# Patient Record
Sex: Male | Born: 1935 | Race: White | Hispanic: No
Health system: Southern US, Community
[De-identification: ages and names within clinical notes are randomized; demographics above are authoritative.]

## PROBLEM LIST (undated history)

## (undated) DIAGNOSIS — C801 Malignant (primary) neoplasm, unspecified: Secondary | ICD-10-CM

---

## 2000-06-09 ENCOUNTER — Ambulatory Visit (HOSPITAL_COMMUNITY): Admission: RE | Admit: 2000-06-09 | Discharge: 2000-06-09 | Payer: Self-pay | Admitting: Family Medicine

## 2000-06-09 ENCOUNTER — Encounter: Payer: Self-pay | Admitting: Family Medicine

## 2000-10-07 ENCOUNTER — Inpatient Hospital Stay (HOSPITAL_COMMUNITY): Admission: EM | Admit: 2000-10-07 | Discharge: 2000-10-12 | Payer: Self-pay | Admitting: Emergency Medicine

## 2000-10-07 ENCOUNTER — Encounter: Payer: Self-pay | Admitting: Emergency Medicine

## 2000-12-16 ENCOUNTER — Emergency Department (HOSPITAL_COMMUNITY): Admission: EM | Admit: 2000-12-16 | Discharge: 2000-12-16 | Payer: Self-pay | Admitting: Emergency Medicine

## 2001-02-04 ENCOUNTER — Emergency Department (HOSPITAL_COMMUNITY): Admission: EM | Admit: 2001-02-04 | Discharge: 2001-02-05 | Payer: Self-pay | Admitting: Emergency Medicine

## 2001-02-04 ENCOUNTER — Encounter: Payer: Self-pay | Admitting: Emergency Medicine

## 2001-04-07 ENCOUNTER — Encounter: Payer: Self-pay | Admitting: Family Medicine

## 2001-04-07 ENCOUNTER — Ambulatory Visit (HOSPITAL_COMMUNITY): Admission: RE | Admit: 2001-04-07 | Discharge: 2001-04-07 | Payer: Self-pay | Admitting: Family Medicine

## 2001-04-10 ENCOUNTER — Encounter: Payer: Self-pay | Admitting: Family Medicine

## 2001-04-10 ENCOUNTER — Ambulatory Visit (HOSPITAL_COMMUNITY): Admission: RE | Admit: 2001-04-10 | Discharge: 2001-04-10 | Payer: Self-pay | Admitting: Family Medicine

## 2001-07-01 ENCOUNTER — Ambulatory Visit (HOSPITAL_COMMUNITY): Admission: RE | Admit: 2001-07-01 | Discharge: 2001-07-01 | Payer: Self-pay | Admitting: Family Medicine

## 2001-07-01 ENCOUNTER — Encounter: Payer: Self-pay | Admitting: Family Medicine

## 2001-07-08 ENCOUNTER — Encounter: Payer: Self-pay | Admitting: *Deleted

## 2001-07-08 ENCOUNTER — Ambulatory Visit (HOSPITAL_COMMUNITY): Admission: RE | Admit: 2001-07-08 | Discharge: 2001-07-08 | Payer: Self-pay | Admitting: *Deleted

## 2001-08-19 ENCOUNTER — Emergency Department (HOSPITAL_COMMUNITY): Admission: EM | Admit: 2001-08-19 | Discharge: 2001-08-19 | Payer: Self-pay | Admitting: Emergency Medicine

## 2001-09-28 ENCOUNTER — Emergency Department (HOSPITAL_COMMUNITY): Admission: EM | Admit: 2001-09-28 | Discharge: 2001-09-28 | Payer: Self-pay | Admitting: Emergency Medicine

## 2001-11-15 ENCOUNTER — Emergency Department (HOSPITAL_COMMUNITY): Admission: EM | Admit: 2001-11-15 | Discharge: 2001-11-15 | Payer: Self-pay | Admitting: Emergency Medicine

## 2002-02-15 ENCOUNTER — Inpatient Hospital Stay (HOSPITAL_COMMUNITY): Admission: EM | Admit: 2002-02-15 | Discharge: 2002-02-21 | Payer: Self-pay | Admitting: Psychiatry

## 2002-03-05 ENCOUNTER — Encounter: Payer: Self-pay | Admitting: Emergency Medicine

## 2002-03-06 ENCOUNTER — Inpatient Hospital Stay (HOSPITAL_COMMUNITY): Admission: EM | Admit: 2002-03-06 | Discharge: 2002-03-16 | Payer: Self-pay | Admitting: Psychiatry

## 2002-03-28 ENCOUNTER — Inpatient Hospital Stay (HOSPITAL_COMMUNITY): Admission: EM | Admit: 2002-03-28 | Discharge: 2002-04-08 | Payer: Self-pay | Admitting: Psychiatry

## 2002-05-30 ENCOUNTER — Inpatient Hospital Stay (HOSPITAL_COMMUNITY): Admission: EM | Admit: 2002-05-30 | Discharge: 2002-06-07 | Payer: Self-pay | Admitting: Psychiatry

## 2002-06-18 ENCOUNTER — Emergency Department (HOSPITAL_COMMUNITY): Admission: EM | Admit: 2002-06-18 | Discharge: 2002-06-18 | Payer: Self-pay | Admitting: Emergency Medicine

## 2002-09-07 ENCOUNTER — Emergency Department (HOSPITAL_COMMUNITY): Admission: EM | Admit: 2002-09-07 | Discharge: 2002-09-07 | Payer: Self-pay | Admitting: Emergency Medicine

## 2002-10-08 ENCOUNTER — Encounter: Payer: Self-pay | Admitting: Emergency Medicine

## 2002-10-08 ENCOUNTER — Emergency Department (HOSPITAL_COMMUNITY): Admission: EM | Admit: 2002-10-08 | Discharge: 2002-10-08 | Payer: Self-pay | Admitting: Emergency Medicine

## 2002-10-30 ENCOUNTER — Emergency Department (HOSPITAL_COMMUNITY): Admission: EM | Admit: 2002-10-30 | Discharge: 2002-10-30 | Payer: Self-pay | Admitting: Emergency Medicine

## 2002-11-08 ENCOUNTER — Emergency Department (HOSPITAL_COMMUNITY): Admission: EM | Admit: 2002-11-08 | Discharge: 2002-11-08 | Payer: Self-pay | Admitting: Emergency Medicine

## 2002-11-08 ENCOUNTER — Encounter: Payer: Self-pay | Admitting: Emergency Medicine

## 2003-02-11 ENCOUNTER — Emergency Department (HOSPITAL_COMMUNITY): Admission: EM | Admit: 2003-02-11 | Discharge: 2003-02-11 | Payer: Self-pay | Admitting: Emergency Medicine

## 2003-02-13 ENCOUNTER — Inpatient Hospital Stay (HOSPITAL_COMMUNITY): Admission: EM | Admit: 2003-02-13 | Discharge: 2003-02-19 | Payer: Self-pay | Admitting: Psychiatry

## 2004-05-04 ENCOUNTER — Emergency Department (HOSPITAL_COMMUNITY): Admission: EM | Admit: 2004-05-04 | Discharge: 2004-05-04 | Payer: Self-pay | Admitting: Emergency Medicine

## 2004-05-08 ENCOUNTER — Emergency Department (HOSPITAL_COMMUNITY): Admission: EM | Admit: 2004-05-08 | Discharge: 2004-05-08 | Payer: Self-pay | Admitting: Emergency Medicine

## 2004-05-11 ENCOUNTER — Emergency Department (HOSPITAL_COMMUNITY): Admission: EM | Admit: 2004-05-11 | Discharge: 2004-05-11 | Payer: Self-pay | Admitting: Emergency Medicine

## 2004-05-15 ENCOUNTER — Encounter (HOSPITAL_COMMUNITY): Admission: RE | Admit: 2004-05-15 | Discharge: 2004-08-13 | Payer: Self-pay | Admitting: Emergency Medicine

## 2004-09-22 ENCOUNTER — Emergency Department (HOSPITAL_COMMUNITY): Admission: EM | Admit: 2004-09-22 | Discharge: 2004-09-23 | Payer: Self-pay | Admitting: Emergency Medicine

## 2007-02-16 ENCOUNTER — Ambulatory Visit: Payer: Self-pay | Admitting: Internal Medicine

## 2007-03-05 ENCOUNTER — Ambulatory Visit: Payer: Self-pay | Admitting: Internal Medicine

## 2007-06-07 ENCOUNTER — Encounter: Payer: Self-pay | Admitting: Internal Medicine

## 2007-06-07 ENCOUNTER — Ambulatory Visit: Payer: Self-pay | Admitting: Internal Medicine

## 2008-10-14 ENCOUNTER — Emergency Department (HOSPITAL_COMMUNITY): Admission: EM | Admit: 2008-10-14 | Discharge: 2008-10-14 | Payer: Self-pay | Admitting: Emergency Medicine

## 2008-11-17 ENCOUNTER — Encounter: Admission: RE | Admit: 2008-11-17 | Discharge: 2008-11-17 | Payer: Self-pay | Admitting: Internal Medicine

## 2008-11-28 ENCOUNTER — Ambulatory Visit (HOSPITAL_COMMUNITY): Admission: RE | Admit: 2008-11-28 | Discharge: 2008-11-28 | Payer: Self-pay | Admitting: Thoracic Surgery

## 2008-11-30 ENCOUNTER — Ambulatory Visit: Payer: Self-pay | Admitting: Thoracic Surgery

## 2008-12-06 ENCOUNTER — Ambulatory Visit (HOSPITAL_COMMUNITY): Admission: RE | Admit: 2008-12-06 | Discharge: 2008-12-06 | Payer: Self-pay | Admitting: Thoracic Surgery

## 2008-12-12 ENCOUNTER — Ambulatory Visit: Payer: Self-pay | Admitting: Thoracic Surgery

## 2009-03-15 ENCOUNTER — Encounter: Payer: Self-pay | Admitting: Internal Medicine

## 2009-03-28 ENCOUNTER — Encounter: Admission: RE | Admit: 2009-03-28 | Discharge: 2009-03-28 | Payer: Self-pay | Admitting: Thoracic Surgery

## 2009-03-28 ENCOUNTER — Encounter: Admission: RE | Admit: 2009-03-28 | Discharge: 2009-03-28 | Payer: Self-pay | Admitting: Otolaryngology

## 2009-03-28 ENCOUNTER — Ambulatory Visit: Payer: Self-pay | Admitting: Thoracic Surgery

## 2009-04-03 ENCOUNTER — Ambulatory Visit (HOSPITAL_COMMUNITY): Admission: RE | Admit: 2009-04-03 | Discharge: 2009-04-03 | Payer: Self-pay | Admitting: Thoracic Surgery

## 2009-04-04 ENCOUNTER — Ambulatory Visit (HOSPITAL_COMMUNITY): Admission: RE | Admit: 2009-04-04 | Discharge: 2009-04-04 | Payer: Self-pay | Admitting: Otolaryngology

## 2009-04-04 ENCOUNTER — Encounter (INDEPENDENT_AMBULATORY_CARE_PROVIDER_SITE_OTHER): Payer: Self-pay | Admitting: Otolaryngology

## 2009-04-10 ENCOUNTER — Ambulatory Visit: Payer: Self-pay | Admitting: Oncology

## 2009-04-16 LAB — COMPREHENSIVE METABOLIC PANEL
ALT: 10 U/L (ref 0–53)
CO2: 24 mEq/L (ref 19–32)
Calcium: 9 mg/dL (ref 8.4–10.5)
Chloride: 101 mEq/L (ref 96–112)
Glucose, Bld: 82 mg/dL (ref 70–99)
Sodium: 137 mEq/L (ref 135–145)
Total Protein: 7.4 g/dL (ref 6.0–8.3)

## 2009-04-16 LAB — CBC WITH DIFFERENTIAL/PLATELET
BASO%: 1.1 % (ref 0.0–2.0)
Eosinophils Absolute: 0.1 10*3/uL (ref 0.0–0.5)
HCT: 42.8 % (ref 38.4–49.9)
LYMPH%: 23.3 % (ref 14.0–49.0)
MCHC: 34.7 g/dL (ref 32.0–36.0)
MONO#: 0.8 10*3/uL (ref 0.1–0.9)
NEUT#: 5.7 10*3/uL (ref 1.5–6.5)
NEUT%: 65.2 % (ref 39.0–75.0)
Platelets: 329 10*3/uL (ref 140–400)
RBC: 4.67 10*6/uL (ref 4.20–5.82)
WBC: 8.7 10*3/uL (ref 4.0–10.3)
lymph#: 2 10*3/uL (ref 0.9–3.3)

## 2009-04-19 ENCOUNTER — Encounter (INDEPENDENT_AMBULATORY_CARE_PROVIDER_SITE_OTHER): Payer: Self-pay | Admitting: Diagnostic Radiology

## 2009-04-19 ENCOUNTER — Ambulatory Visit (HOSPITAL_COMMUNITY): Admission: RE | Admit: 2009-04-19 | Discharge: 2009-04-19 | Payer: Self-pay | Admitting: Thoracic Surgery

## 2009-04-24 ENCOUNTER — Ambulatory Visit: Admission: RE | Admit: 2009-04-24 | Discharge: 2009-07-11 | Payer: Self-pay | Admitting: Radiation Oncology

## 2009-04-24 ENCOUNTER — Ambulatory Visit: Payer: Self-pay | Admitting: Thoracic Surgery

## 2009-04-26 LAB — URINALYSIS, MICROSCOPIC - CHCC
Bilirubin (Urine): NEGATIVE
Blood: NEGATIVE
Glucose: NEGATIVE g/dL

## 2009-04-26 LAB — CBC WITH DIFFERENTIAL/PLATELET
Eosinophils Absolute: 0 10*3/uL (ref 0.0–0.5)
HCT: 39.8 % (ref 38.4–49.9)
LYMPH%: 15.3 % (ref 14.0–49.0)
MONO#: 1.2 10*3/uL — ABNORMAL HIGH (ref 0.1–0.9)
NEUT#: 9.5 10*3/uL — ABNORMAL HIGH (ref 1.5–6.5)
Platelets: 287 10*3/uL (ref 140–400)
RBC: 4.42 10*6/uL (ref 4.20–5.82)
WBC: 12.7 10*3/uL — ABNORMAL HIGH (ref 4.0–10.3)

## 2009-04-26 LAB — COMPREHENSIVE METABOLIC PANEL
BUN: 28 mg/dL — ABNORMAL HIGH (ref 6–23)
CO2: 31 mEq/L (ref 19–32)
Creatinine, Ser: 1.01 mg/dL (ref 0.40–1.50)
Glucose, Bld: 119 mg/dL — ABNORMAL HIGH (ref 70–99)
Total Bilirubin: 0.7 mg/dL (ref 0.3–1.2)
Total Protein: 7.1 g/dL (ref 6.0–8.3)

## 2009-04-26 LAB — PROTHROMBIN TIME: Prothrombin Time: 14.2 seconds (ref 11.6–15.2)

## 2009-04-26 LAB — APTT: aPTT: 34 seconds (ref 24–37)

## 2009-04-27 ENCOUNTER — Ambulatory Visit (HOSPITAL_COMMUNITY): Admission: RE | Admit: 2009-04-27 | Discharge: 2009-04-27 | Payer: Self-pay | Admitting: Urology

## 2009-04-27 ENCOUNTER — Encounter (INDEPENDENT_AMBULATORY_CARE_PROVIDER_SITE_OTHER): Payer: Self-pay | Admitting: Urology

## 2009-04-30 ENCOUNTER — Ambulatory Visit (HOSPITAL_COMMUNITY): Admission: RE | Admit: 2009-04-30 | Discharge: 2009-04-30 | Payer: Self-pay | Admitting: Radiation Oncology

## 2009-05-01 ENCOUNTER — Inpatient Hospital Stay (HOSPITAL_COMMUNITY): Admission: EM | Admit: 2009-05-01 | Discharge: 2009-05-03 | Payer: Self-pay | Admitting: Emergency Medicine

## 2009-05-02 ENCOUNTER — Ambulatory Visit: Payer: Self-pay | Admitting: Dentistry

## 2009-08-01 ENCOUNTER — Ambulatory Visit: Payer: Self-pay | Admitting: Thoracic Surgery

## 2009-08-01 ENCOUNTER — Encounter: Admission: RE | Admit: 2009-08-01 | Discharge: 2009-08-01 | Payer: Self-pay | Admitting: Thoracic Surgery

## 2009-08-07 ENCOUNTER — Ambulatory Visit: Admission: RE | Admit: 2009-08-07 | Discharge: 2009-08-07 | Payer: Self-pay | Admitting: Radiation Oncology

## 2009-08-21 ENCOUNTER — Ambulatory Visit: Payer: Self-pay | Admitting: Thoracic Surgery

## 2009-08-21 ENCOUNTER — Encounter: Admission: RE | Admit: 2009-08-21 | Discharge: 2009-08-21 | Payer: Self-pay | Admitting: Thoracic Surgery

## 2009-08-22 ENCOUNTER — Ambulatory Visit (HOSPITAL_COMMUNITY): Admission: RE | Admit: 2009-08-22 | Discharge: 2009-08-22 | Payer: Self-pay | Admitting: Radiation Oncology

## 2009-08-31 ENCOUNTER — Encounter: Admission: RE | Admit: 2009-08-31 | Discharge: 2009-10-31 | Payer: Self-pay | Admitting: Radiation Oncology

## 2009-08-31 ENCOUNTER — Ambulatory Visit: Payer: Self-pay | Admitting: Vascular Surgery

## 2009-10-04 ENCOUNTER — Ambulatory Visit (HOSPITAL_COMMUNITY): Admission: RE | Admit: 2009-10-04 | Discharge: 2009-10-04 | Payer: Self-pay | Admitting: Radiation Oncology

## 2009-10-10 ENCOUNTER — Ambulatory Visit: Payer: Self-pay | Admitting: Thoracic Surgery

## 2009-12-07 ENCOUNTER — Ambulatory Visit (HOSPITAL_COMMUNITY): Admission: RE | Admit: 2009-12-07 | Discharge: 2009-12-07 | Payer: Self-pay | Admitting: Otolaryngology

## 2010-01-04 ENCOUNTER — Inpatient Hospital Stay (HOSPITAL_COMMUNITY): Admission: RE | Admit: 2010-01-04 | Discharge: 2010-01-13 | Payer: Self-pay | Admitting: Otolaryngology

## 2010-01-08 ENCOUNTER — Ambulatory Visit: Payer: Self-pay | Admitting: Thoracic Surgery

## 2010-01-09 ENCOUNTER — Ambulatory Visit: Payer: Self-pay | Admitting: Thoracic Surgery

## 2010-01-09 ENCOUNTER — Encounter (INDEPENDENT_AMBULATORY_CARE_PROVIDER_SITE_OTHER): Payer: Self-pay | Admitting: Otolaryngology

## 2010-01-11 ENCOUNTER — Ambulatory Visit: Payer: Self-pay | Admitting: Oncology

## 2010-01-16 ENCOUNTER — Ambulatory Visit: Payer: Self-pay | Admitting: Thoracic Surgery

## 2010-01-16 ENCOUNTER — Encounter: Admission: RE | Admit: 2010-01-16 | Discharge: 2010-01-16 | Payer: Self-pay | Admitting: Thoracic Surgery

## 2010-05-15 ENCOUNTER — Ambulatory Visit: Payer: Self-pay | Admitting: Thoracic Surgery

## 2010-05-15 ENCOUNTER — Encounter: Admission: RE | Admit: 2010-05-15 | Discharge: 2010-05-15 | Payer: Self-pay | Admitting: Thoracic Surgery

## 2010-05-23 ENCOUNTER — Ambulatory Visit (HOSPITAL_COMMUNITY): Admission: RE | Admit: 2010-05-23 | Discharge: 2010-05-23 | Payer: Self-pay | Admitting: Thoracic Surgery

## 2010-05-28 ENCOUNTER — Ambulatory Visit: Payer: Self-pay | Admitting: Thoracic Surgery

## 2010-05-29 ENCOUNTER — Ambulatory Visit: Payer: Self-pay | Admitting: Oncology

## 2010-05-29 LAB — CBC WITH DIFFERENTIAL/PLATELET
Eosinophils Absolute: 0.1 10*3/uL (ref 0.0–0.5)
HCT: 44 % (ref 38.4–49.9)
HGB: 15.1 g/dL (ref 13.0–17.1)
LYMPH%: 12.9 % — ABNORMAL LOW (ref 14.0–49.0)
MONO#: 0.7 10*3/uL (ref 0.1–0.9)
NEUT#: 5.1 10*3/uL (ref 1.5–6.5)
NEUT%: 75.2 % — ABNORMAL HIGH (ref 39.0–75.0)
Platelets: 217 10*3/uL (ref 140–400)
WBC: 6.7 10*3/uL (ref 4.0–10.3)
lymph#: 0.9 10*3/uL (ref 0.9–3.3)

## 2010-05-29 LAB — COMPREHENSIVE METABOLIC PANEL
ALT: <8 U/L (ref 0–53)
CO2: 30 mEq/L (ref 19–32)
Calcium: 9 mg/dL (ref 8.4–10.5)
Chloride: 98 mEq/L (ref 96–112)
Creatinine, Ser: 1.03 mg/dL (ref 0.40–1.50)
Glucose, Bld: 84 mg/dL (ref 70–99)
Total Bilirubin: 0.5 mg/dL (ref 0.3–1.2)
Total Protein: 7.4 g/dL (ref 6.0–8.3)

## 2010-06-05 ENCOUNTER — Ambulatory Visit (HOSPITAL_COMMUNITY): Admission: RE | Admit: 2010-06-05 | Discharge: 2010-06-05 | Payer: Self-pay | Admitting: Oncology

## 2010-06-17 ENCOUNTER — Ambulatory Visit: Payer: Self-pay | Admitting: Gastroenterology

## 2010-06-17 ENCOUNTER — Ambulatory Visit (HOSPITAL_COMMUNITY): Admission: RE | Admit: 2010-06-17 | Discharge: 2010-06-17 | Payer: Self-pay | Admitting: Oncology

## 2010-07-03 ENCOUNTER — Ambulatory Visit: Payer: Self-pay | Admitting: Oncology

## 2010-07-05 LAB — CBC WITH DIFFERENTIAL/PLATELET
BASO%: 0.3 % (ref 0.0–2.0)
LYMPH%: 17.2 % (ref 14.0–49.0)
MCH: 30.6 pg (ref 27.2–33.4)
MCHC: 33.5 g/dL (ref 32.0–36.0)
MCV: 91.4 fL (ref 79.3–98.0)
MONO%: 8.3 % (ref 0.0–14.0)
NEUT%: 73.6 % (ref 39.0–75.0)
Platelets: 169 10*3/uL (ref 140–400)
RBC: 5 10*6/uL (ref 4.20–5.82)
nRBC: 0 % (ref 0–0)

## 2010-07-05 LAB — COMPREHENSIVE METABOLIC PANEL
ALT: 8 U/L (ref 0–53)
AST: 17 U/L (ref 0–37)
Albumin: 3.6 g/dL (ref 3.5–5.2)
Alkaline Phosphatase: 106 U/L (ref 39–117)
Glucose, Bld: 73 mg/dL (ref 70–99)
Potassium: 3.6 mEq/L (ref 3.5–5.3)
Sodium: 138 mEq/L (ref 135–145)
Total Protein: 8 g/dL (ref 6.0–8.3)

## 2010-07-12 LAB — CBC WITH DIFFERENTIAL/PLATELET
BASO%: 0.5 % (ref 0.0–2.0)
Basophils Absolute: 0 10*3/uL (ref 0.0–0.1)
EOS%: 1.7 % (ref 0.0–7.0)
MCH: 30.3 pg (ref 27.2–33.4)
MCHC: 33.6 g/dL (ref 32.0–36.0)
MCV: 90.2 fL (ref 79.3–98.0)
MONO%: 7.9 % (ref 0.0–14.0)
RBC: 4.82 10*6/uL (ref 4.20–5.82)
RDW: 12.6 % (ref 11.0–14.6)
nRBC: 0 % (ref 0–0)

## 2010-07-13 LAB — COMPREHENSIVE METABOLIC PANEL
AST: 20 U/L (ref 0–37)
Alkaline Phosphatase: 107 U/L (ref 39–117)
Glucose, Bld: 97 mg/dL (ref 70–99)
Sodium: 135 mEq/L (ref 135–145)
Total Bilirubin: 0.4 mg/dL (ref 0.3–1.2)
Total Protein: 7.2 g/dL (ref 6.0–8.3)

## 2010-07-17 ENCOUNTER — Encounter: Admission: AD | Admit: 2010-07-17 | Discharge: 2010-07-17 | Payer: Self-pay | Admitting: Dentistry

## 2010-07-17 ENCOUNTER — Ambulatory Visit: Payer: Self-pay | Admitting: Dentistry

## 2010-07-19 LAB — CBC WITH DIFFERENTIAL/PLATELET
Eosinophils Absolute: 0.1 10*3/uL (ref 0.0–0.5)
HCT: 41.3 % (ref 38.4–49.9)
LYMPH%: 20.2 % (ref 14.0–49.0)
MCHC: 33.7 g/dL (ref 32.0–36.0)
MONO#: 0.6 10*3/uL (ref 0.1–0.9)
NEUT#: 3.1 10*3/uL (ref 1.5–6.5)
NEUT%: 64.1 % (ref 39.0–75.0)
Platelets: 144 10*3/uL (ref 140–400)
WBC: 4.8 10*3/uL (ref 4.0–10.3)

## 2010-07-19 LAB — COMPREHENSIVE METABOLIC PANEL
BUN: 9 mg/dL (ref 6–23)
CO2: 27 mEq/L (ref 19–32)
Calcium: 8.7 mg/dL (ref 8.4–10.5)
Chloride: 100 mEq/L (ref 96–112)
Creatinine, Ser: 0.92 mg/dL (ref 0.40–1.50)

## 2010-07-26 LAB — CBC WITH DIFFERENTIAL/PLATELET
BASO%: 0.2 % (ref 0.0–2.0)
Basophils Absolute: 0 10*3/uL (ref 0.0–0.1)
HCT: 41.4 % (ref 38.4–49.9)
HGB: 14.1 g/dL (ref 13.0–17.1)
MONO#: 0.6 10*3/uL (ref 0.1–0.9)
NEUT%: 63.8 % (ref 39.0–75.0)
WBC: 4.4 10*3/uL (ref 4.0–10.3)
lymph#: 1 10*3/uL (ref 0.9–3.3)

## 2010-07-26 LAB — BASIC METABOLIC PANEL
BUN: 11 mg/dL (ref 6–23)
CO2: 26 mEq/L (ref 19–32)
Chloride: 99 mEq/L (ref 96–112)
Creatinine, Ser: 0.85 mg/dL (ref 0.40–1.50)

## 2010-08-02 ENCOUNTER — Ambulatory Visit: Payer: Self-pay | Admitting: Oncology

## 2010-08-02 LAB — COMPREHENSIVE METABOLIC PANEL
Alkaline Phosphatase: 89 U/L (ref 39–117)
BUN: 9 mg/dL (ref 6–23)
Creatinine, Ser: 0.75 mg/dL (ref 0.40–1.50)
Glucose, Bld: 84 mg/dL (ref 70–99)
Sodium: 138 mEq/L (ref 135–145)
Total Bilirubin: 0.3 mg/dL (ref 0.3–1.2)

## 2010-08-02 LAB — CBC WITH DIFFERENTIAL/PLATELET
Basophils Absolute: 0 10*3/uL (ref 0.0–0.1)
EOS%: 0.4 % (ref 0.0–7.0)
HGB: 13.7 g/dL (ref 13.0–17.1)
LYMPH%: 22.5 % (ref 14.0–49.0)
MCH: 30.4 pg (ref 27.2–33.4)
MCV: 89.4 fL (ref 79.3–98.0)
MONO%: 14.8 % — ABNORMAL HIGH (ref 0.0–14.0)
Platelets: 198 10*3/uL (ref 140–400)
RDW: 14 % (ref 11.0–14.6)

## 2010-08-02 LAB — MAGNESIUM: Magnesium: 1.9 mg/dL (ref 1.5–2.5)

## 2010-08-09 LAB — COMPREHENSIVE METABOLIC PANEL
Creatinine, Ser: 0.77 mg/dL (ref 0.40–1.50)
Glucose, Bld: 91 mg/dL (ref 70–99)
Potassium: 4.1 mEq/L (ref 3.5–5.3)
Sodium: 138 mEq/L (ref 135–145)
Total Protein: 6 g/dL (ref 6.0–8.3)

## 2010-08-09 LAB — CBC WITH DIFFERENTIAL/PLATELET
BASO%: 0.9 % (ref 0.0–2.0)
Eosinophils Absolute: 0 10*3/uL (ref 0.0–0.5)
HCT: 38.3 % — ABNORMAL LOW (ref 38.4–49.9)
HGB: 13 g/dL (ref 13.0–17.1)
MCHC: 33.9 g/dL (ref 32.0–36.0)
MONO#: 0.4 10*3/uL (ref 0.1–0.9)
NEUT#: 2.3 10*3/uL (ref 1.5–6.5)
NEUT%: 65.8 % (ref 39.0–75.0)
WBC: 3.5 10*3/uL — ABNORMAL LOW (ref 4.0–10.3)
lymph#: 0.7 10*3/uL — ABNORMAL LOW (ref 0.9–3.3)

## 2010-08-09 LAB — MAGNESIUM: Magnesium: 2 mg/dL (ref 1.5–2.5)

## 2010-08-15 LAB — CBC WITH DIFFERENTIAL/PLATELET
Basophils Absolute: 0 10*3/uL (ref 0.0–0.1)
EOS%: 0.5 % (ref 0.0–7.0)
HCT: 35.7 % — ABNORMAL LOW (ref 38.4–49.9)
HGB: 12.5 g/dL — ABNORMAL LOW (ref 13.0–17.1)
MCH: 31.6 pg (ref 27.2–33.4)
MONO#: 0.4 10*3/uL (ref 0.1–0.9)
NEUT%: 63.6 % (ref 39.0–75.0)
Platelets: 194 10*3/uL (ref 140–400)
lymph#: 0.7 10*3/uL — ABNORMAL LOW (ref 0.9–3.3)

## 2010-08-15 LAB — COMPREHENSIVE METABOLIC PANEL
BUN: 6 mg/dL (ref 6–23)
CO2: 25 mEq/L (ref 19–32)
Calcium: 8.6 mg/dL (ref 8.4–10.5)
Chloride: 101 mEq/L (ref 96–112)
Creatinine, Ser: 0.68 mg/dL (ref 0.40–1.50)

## 2010-08-23 LAB — CBC WITH DIFFERENTIAL/PLATELET
Basophils Absolute: 0.1 10*3/uL (ref 0.0–0.1)
Eosinophils Absolute: 0 10*3/uL (ref 0.0–0.5)
HCT: 38 % — ABNORMAL LOW (ref 38.4–49.9)
HGB: 12.8 g/dL — ABNORMAL LOW (ref 13.0–17.1)
MONO#: 0.5 10*3/uL (ref 0.1–0.9)
NEUT%: 58.9 % (ref 39.0–75.0)
WBC: 3.4 10*3/uL — ABNORMAL LOW (ref 4.0–10.3)
lymph#: 0.9 10*3/uL (ref 0.9–3.3)

## 2010-08-30 LAB — COMPREHENSIVE METABOLIC PANEL
Albumin: 3.8 g/dL (ref 3.5–5.2)
Alkaline Phosphatase: 96 U/L (ref 39–117)
BUN: 6 mg/dL (ref 6–23)
Calcium: 8.6 mg/dL (ref 8.4–10.5)
Glucose, Bld: 60 mg/dL — ABNORMAL LOW (ref 70–99)
Potassium: 3.2 mEq/L — ABNORMAL LOW (ref 3.5–5.3)

## 2010-08-30 LAB — CBC WITH DIFFERENTIAL/PLATELET
Basophils Absolute: 0 10*3/uL (ref 0.0–0.1)
Eosinophils Absolute: 0 10*3/uL (ref 0.0–0.5)
HGB: 12.3 g/dL — ABNORMAL LOW (ref 13.0–17.1)
MCV: 92.7 fL (ref 79.3–98.0)
MONO%: 19.5 % — ABNORMAL HIGH (ref 0.0–14.0)
NEUT#: 1.8 10*3/uL (ref 1.5–6.5)
Platelets: 170 10*3/uL (ref 140–400)
RDW: 20 % — ABNORMAL HIGH (ref 11.0–14.6)

## 2010-08-30 LAB — MAGNESIUM: Magnesium: 1.9 mg/dL (ref 1.5–2.5)

## 2010-09-04 ENCOUNTER — Ambulatory Visit: Payer: Self-pay | Admitting: Oncology

## 2010-09-06 LAB — COMPREHENSIVE METABOLIC PANEL
CO2: 26 mEq/L (ref 19–32)
Creatinine, Ser: 0.77 mg/dL (ref 0.40–1.50)
Glucose, Bld: 88 mg/dL (ref 70–99)
Total Bilirubin: 0.6 mg/dL (ref 0.3–1.2)

## 2010-09-06 LAB — CBC WITH DIFFERENTIAL/PLATELET
BASO%: 0.5 % (ref 0.0–2.0)
Basophils Absolute: 0 10*3/uL (ref 0.0–0.1)
Eosinophils Absolute: 0 10*3/uL (ref 0.0–0.5)
HCT: 37.8 % — ABNORMAL LOW (ref 38.4–49.9)
HGB: 12.7 g/dL — ABNORMAL LOW (ref 13.0–17.1)
MONO#: 0.4 10*3/uL (ref 0.1–0.9)
NEUT#: 2.8 10*3/uL (ref 1.5–6.5)
NEUT%: 63.4 % (ref 39.0–75.0)
WBC: 4.4 10*3/uL (ref 4.0–10.3)
lymph#: 1.1 10*3/uL (ref 0.9–3.3)

## 2010-09-06 LAB — MAGNESIUM: Magnesium: 1.9 mg/dL (ref 1.5–2.5)

## 2010-09-13 LAB — COMPREHENSIVE METABOLIC PANEL
ALT: 13 U/L (ref 0–53)
Albumin: 4 g/dL (ref 3.5–5.2)
Alkaline Phosphatase: 98 U/L (ref 39–117)
CO2: 24 mEq/L (ref 19–32)
Glucose, Bld: 150 mg/dL — ABNORMAL HIGH (ref 70–99)
Potassium: 4 mEq/L (ref 3.5–5.3)
Sodium: 141 mEq/L (ref 135–145)
Total Protein: 6.9 g/dL (ref 6.0–8.3)

## 2010-09-13 LAB — CBC WITH DIFFERENTIAL/PLATELET
Basophils Absolute: 0.1 10*3/uL (ref 0.0–0.1)
EOS%: 1 % (ref 0.0–7.0)
Eosinophils Absolute: 0 10*3/uL (ref 0.0–0.5)
HGB: 12.4 g/dL — ABNORMAL LOW (ref 13.0–17.1)
NEUT#: 1.8 10*3/uL (ref 1.5–6.5)
RDW: 19.3 % — ABNORMAL HIGH (ref 11.0–14.6)
lymph#: 1.6 10*3/uL (ref 0.9–3.3)
nRBC: 0 % (ref 0–0)

## 2010-09-20 LAB — COMPREHENSIVE METABOLIC PANEL
Albumin: 3.9 g/dL (ref 3.5–5.2)
CO2: 24 mEq/L (ref 19–32)
Calcium: 8.6 mg/dL (ref 8.4–10.5)
Glucose, Bld: 85 mg/dL (ref 70–99)
Potassium: 4.4 mEq/L (ref 3.5–5.3)
Sodium: 139 mEq/L (ref 135–145)
Total Bilirubin: 0.5 mg/dL (ref 0.3–1.2)
Total Protein: 6.3 g/dL (ref 6.0–8.3)

## 2010-09-20 LAB — CBC WITH DIFFERENTIAL/PLATELET
Basophils Absolute: 0 10*3/uL (ref 0.0–0.1)
Eosinophils Absolute: 0 10*3/uL (ref 0.0–0.5)
HGB: 12.8 g/dL — ABNORMAL LOW (ref 13.0–17.1)
MCV: 94.3 fL (ref 79.3–98.0)
MONO%: 16.7 % — ABNORMAL HIGH (ref 0.0–14.0)
NEUT#: 1.8 10*3/uL (ref 1.5–6.5)
RDW: 19.5 % — ABNORMAL HIGH (ref 11.0–14.6)

## 2010-09-20 LAB — MAGNESIUM: Magnesium: 1.7 mg/dL (ref 1.5–2.5)

## 2010-09-27 LAB — CBC WITH DIFFERENTIAL/PLATELET
BASO%: 0.8 % (ref 0.0–2.0)
MCHC: 33.7 g/dL (ref 32.0–36.0)
MONO#: 0.8 10*3/uL (ref 0.1–0.9)
RBC: 3.81 10*6/uL — ABNORMAL LOW (ref 4.20–5.82)
RDW: 20 % — ABNORMAL HIGH (ref 11.0–14.6)
WBC: 4.9 10*3/uL (ref 4.0–10.3)
lymph#: 1.4 10*3/uL (ref 0.9–3.3)
nRBC: 1 % — ABNORMAL HIGH (ref 0–0)

## 2010-10-09 ENCOUNTER — Ambulatory Visit (HOSPITAL_COMMUNITY)
Admission: RE | Admit: 2010-10-09 | Discharge: 2010-10-09 | Payer: Self-pay | Source: Home / Self Care | Attending: Oncology | Admitting: Oncology

## 2010-10-09 ENCOUNTER — Ambulatory Visit: Payer: Self-pay | Admitting: Oncology

## 2010-10-11 LAB — CBC WITH DIFFERENTIAL/PLATELET
BASO%: 0.2 % (ref 0.0–2.0)
EOS%: 0.1 % (ref 0.0–7.0)
HCT: 42.7 % (ref 38.4–49.9)
LYMPH%: 10 % — ABNORMAL LOW (ref 14.0–49.0)
MCH: 32.4 pg (ref 27.2–33.4)
MCHC: 32.8 g/dL (ref 32.0–36.0)
MONO#: 1.7 10*3/uL — ABNORMAL HIGH (ref 0.1–0.9)
MONO%: 8.4 % (ref 0.0–14.0)
NEUT%: 81.3 % — ABNORMAL HIGH (ref 39.0–75.0)
Platelets: 169 10*3/uL (ref 140–400)
RBC: 4.32 10*6/uL (ref 4.20–5.82)
WBC: 20.5 10*3/uL — ABNORMAL HIGH (ref 4.0–10.3)
nRBC: 0 % (ref 0–0)

## 2010-10-12 LAB — COMPREHENSIVE METABOLIC PANEL
ALT: 9 U/L (ref 0–53)
Albumin: 3.5 g/dL (ref 3.5–5.2)
Alkaline Phosphatase: 177 U/L — ABNORMAL HIGH (ref 39–117)
CO2: 25 mEq/L (ref 19–32)
Glucose, Bld: 199 mg/dL — ABNORMAL HIGH (ref 70–99)
Potassium: 4 mEq/L (ref 3.5–5.3)
Sodium: 140 mEq/L (ref 135–145)
Total Bilirubin: 0.6 mg/dL (ref 0.3–1.2)
Total Protein: 6.2 g/dL (ref 6.0–8.3)

## 2010-10-18 ENCOUNTER — Ambulatory Visit (HOSPITAL_COMMUNITY)
Admission: RE | Admit: 2010-10-18 | Discharge: 2010-10-18 | Payer: Self-pay | Source: Home / Self Care | Attending: Oncology | Admitting: Oncology

## 2010-10-18 LAB — CBC WITH DIFFERENTIAL/PLATELET
Basophils Absolute: 0 10*3/uL (ref 0.0–0.1)
EOS%: 0.6 % (ref 0.0–7.0)
Eosinophils Absolute: 0 10*3/uL (ref 0.0–0.5)
HGB: 12.3 g/dL — ABNORMAL LOW (ref 13.0–17.1)
LYMPH%: 13.3 % — ABNORMAL LOW (ref 14.0–49.0)
MCH: 32.5 pg (ref 27.2–33.4)
MCV: 98.2 fL — ABNORMAL HIGH (ref 79.3–98.0)
MONO%: 8.7 % (ref 0.0–14.0)
NEUT#: 5.3 10*3/uL (ref 1.5–6.5)
Platelets: 149 10*3/uL (ref 140–400)
RBC: 3.79 10*6/uL — ABNORMAL LOW (ref 4.20–5.82)

## 2010-10-18 LAB — COMPREHENSIVE METABOLIC PANEL
ALT: <8 U/L (ref 0–53)
AST: 15 U/L (ref 0–37)
Albumin: 3.3 g/dL — ABNORMAL LOW (ref 3.5–5.2)
Alkaline Phosphatase: 142 U/L — ABNORMAL HIGH (ref 39–117)
BUN: 18 mg/dL (ref 6–23)
Chloride: 106 mEq/L (ref 96–112)
Potassium: 4.3 mEq/L (ref 3.5–5.3)

## 2010-11-01 LAB — CBC WITH DIFFERENTIAL/PLATELET
BASO%: 0.8 % (ref 0.0–2.0)
Basophils Absolute: 0 10*3/uL (ref 0.0–0.1)
EOS%: 0.8 % (ref 0.0–7.0)
MCH: 32.6 pg (ref 27.2–33.4)
MCHC: 32.9 g/dL (ref 32.0–36.0)
MCV: 99 fL — ABNORMAL HIGH (ref 79.3–98.0)
MONO%: 20.4 % — ABNORMAL HIGH (ref 0.0–14.0)
RBC: 3.87 10*6/uL — ABNORMAL LOW (ref 4.20–5.82)
RDW: 15.5 % — ABNORMAL HIGH (ref 11.0–14.6)
lymph#: 1.7 10*3/uL (ref 0.9–3.3)

## 2010-11-08 ENCOUNTER — Ambulatory Visit (HOSPITAL_BASED_OUTPATIENT_CLINIC_OR_DEPARTMENT_OTHER): Payer: MEDICARE | Admitting: Oncology

## 2010-11-08 LAB — CBC WITH DIFFERENTIAL/PLATELET
BASO%: 1.3 % (ref 0.0–2.0)
Basophils Absolute: 0.1 10*3/uL (ref 0.0–0.1)
EOS%: 0.6 % (ref 0.0–7.0)
Eosinophils Absolute: 0 10*3/uL (ref 0.0–0.5)
HCT: 37.6 % — ABNORMAL LOW (ref 38.4–49.9)
HGB: 12.6 g/dL — ABNORMAL LOW (ref 13.0–17.1)
LYMPH%: 25.3 % (ref 14.0–49.0)
MCH: 33.4 pg (ref 27.2–33.4)
MCHC: 33.5 g/dL (ref 32.0–36.0)
MCV: 99.7 fL — ABNORMAL HIGH (ref 79.3–98.0)
MONO#: 0.8 10*3/uL (ref 0.1–0.9)
MONO%: 10.1 % (ref 0.0–14.0)
NEUT#: 4.8 10*3/uL (ref 1.5–6.5)
NEUT%: 62.7 % (ref 39.0–75.0)
Platelets: 200 10*3/uL (ref 140–400)
RBC: 3.77 10*6/uL — ABNORMAL LOW (ref 4.20–5.82)
RDW: 16.3 % — ABNORMAL HIGH (ref 11.0–14.6)
WBC: 7.7 10*3/uL (ref 4.0–10.3)
lymph#: 1.9 10*3/uL (ref 0.9–3.3)

## 2010-11-08 LAB — COMPREHENSIVE METABOLIC PANEL
ALT: <8 U/L (ref 0–53)
AST: 16 U/L (ref 0–37)
Albumin: 3.6 g/dL (ref 3.5–5.2)
Alkaline Phosphatase: 195 U/L — ABNORMAL HIGH (ref 39–117)
BUN: 7 mg/dL (ref 6–23)
CO2: 26 mEq/L (ref 19–32)
Calcium: 8.6 mg/dL (ref 8.4–10.5)
Chloride: 104 mEq/L (ref 96–112)
Creatinine, Ser: 0.78 mg/dL (ref 0.40–1.50)
Glucose, Bld: 74 mg/dL (ref 70–99)
Potassium: 4.2 mEq/L (ref 3.5–5.3)
Sodium: 139 mEq/L (ref 135–145)
Total Bilirubin: 0.2 mg/dL — ABNORMAL LOW (ref 0.3–1.2)
Total Protein: 6.5 g/dL (ref 6.0–8.3)

## 2010-11-08 LAB — MAGNESIUM: Magnesium: 1.8 mg/dL (ref 1.5–2.5)

## 2010-11-14 ENCOUNTER — Ambulatory Visit (HOSPITAL_COMMUNITY)
Admission: RE | Admit: 2010-11-14 | Discharge: 2010-11-14 | Payer: Self-pay | Source: Home / Self Care | Attending: Oncology | Admitting: Oncology

## 2010-11-15 LAB — CBC WITH DIFFERENTIAL/PLATELET
BASO%: 0.5 % (ref 0.0–2.0)
Basophils Absolute: 0 10*3/uL (ref 0.0–0.1)
EOS%: 0.6 % (ref 0.0–7.0)
Eosinophils Absolute: 0 10*3/uL (ref 0.0–0.5)
HCT: 39.7 % (ref 38.4–49.9)
HGB: 13.2 g/dL (ref 13.0–17.1)
LYMPH%: 19.2 % (ref 14.0–49.0)
MCH: 32.5 pg (ref 27.2–33.4)
MCHC: 33.2 g/dL (ref 32.0–36.0)
MCV: 97.8 fL (ref 79.3–98.0)
MONO#: 0.4 10*3/uL (ref 0.1–0.9)
MONO%: 6.9 % (ref 0.0–14.0)
NEUT#: 4.7 10*3/uL (ref 1.5–6.5)
NEUT%: 72.8 % (ref 39.0–75.0)
Platelets: 149 10*3/uL (ref 140–400)
RBC: 4.06 10*6/uL — ABNORMAL LOW (ref 4.20–5.82)
RDW: 14.5 % (ref 11.0–14.6)
WBC: 6.4 10*3/uL (ref 4.0–10.3)
lymph#: 1.2 10*3/uL (ref 0.9–3.3)
nRBC: 0 % (ref 0–0)

## 2010-11-15 LAB — COMPREHENSIVE METABOLIC PANEL
ALT: 8 U/L (ref 0–53)
AST: 20 U/L (ref 0–37)
Albumin: 3.6 g/dL (ref 3.5–5.2)
Alkaline Phosphatase: 165 U/L — ABNORMAL HIGH (ref 39–117)
BUN: 8 mg/dL (ref 6–23)
CO2: 24 mEq/L (ref 19–32)
Calcium: 8.7 mg/dL (ref 8.4–10.5)
Chloride: 105 mEq/L (ref 96–112)
Creatinine, Ser: 0.65 mg/dL (ref 0.40–1.50)
Glucose, Bld: 119 mg/dL — ABNORMAL HIGH (ref 70–99)
Potassium: 3.9 mEq/L (ref 3.5–5.3)
Sodium: 139 mEq/L (ref 135–145)
Total Bilirubin: 0.3 mg/dL (ref 0.3–1.2)
Total Protein: 6.5 g/dL (ref 6.0–8.3)

## 2010-11-15 LAB — MAGNESIUM: Magnesium: 1.6 mg/dL (ref 1.5–2.5)

## 2010-11-24 ENCOUNTER — Encounter: Payer: Self-pay | Admitting: Radiation Oncology

## 2010-11-24 ENCOUNTER — Encounter: Payer: Self-pay | Admitting: Thoracic Surgery

## 2010-11-25 ENCOUNTER — Encounter: Payer: Self-pay | Admitting: Oncology

## 2010-11-29 LAB — CBC WITH DIFFERENTIAL/PLATELET
BASO%: 0.4 % (ref 0.0–2.0)
EOS%: 0.5 % (ref 0.0–7.0)
Eosinophils Absolute: 0 10*3/uL (ref 0.0–0.5)
HCT: 38.6 % (ref 38.4–49.9)
HGB: 12.8 g/dL — ABNORMAL LOW (ref 13.0–17.1)
LYMPH%: 23.7 % (ref 14.0–49.0)
MCH: 32.8 pg (ref 27.2–33.4)
MCHC: 33.2 g/dL (ref 32.0–36.0)
MCV: 98.8 fL — ABNORMAL HIGH (ref 79.3–98.0)
MONO%: 14.2 % — ABNORMAL HIGH (ref 0.0–14.0)
NEUT#: 4.2 10*3/uL (ref 1.5–6.5)
Platelets: 234 10*3/uL (ref 140–400)
RDW: 15.2 % — ABNORMAL HIGH (ref 11.0–14.6)

## 2010-11-29 LAB — COMPREHENSIVE METABOLIC PANEL
ALT: 10 U/L (ref 0–53)
AST: 18 U/L (ref 0–37)
Albumin: 3.5 g/dL (ref 3.5–5.2)
BUN: 6 mg/dL (ref 6–23)
CO2: 25 mEq/L (ref 19–32)
Chloride: 105 mEq/L (ref 96–112)
Glucose, Bld: 88 mg/dL (ref 70–99)
Potassium: 4.1 mEq/L (ref 3.5–5.3)
Total Bilirubin: 0.3 mg/dL (ref 0.3–1.2)
Total Protein: 6.3 g/dL (ref 6.0–8.3)

## 2010-11-29 LAB — MAGNESIUM: Magnesium: 1.7 mg/dL (ref 1.5–2.5)

## 2010-12-03 NOTE — Procedures (Signed)
Summary: Upper Endoscopy  Patient: [name] Note: All result statuses are Final unless otherwise noted.  Tests: (1) Upper Endoscopy (EGD)   EGD Upper Endoscopy       DONE (C)     [facility]     [address] [place], [place]  [address]           ENDOSCOPY PROCEDURE REPORT           PATIENT:  [name], [name]  MR#:  [id number]     BIRTHDATE:  [birth year], 76 yrs. old  GENDER:  male           ENDOSCOPIST:  Barbette Hair. Arlyce Dice, MD     Referred by:           PROCEDURE DATE:  06/17/2010     PROCEDURE:  EGD with foreign body removal     ASA CLASS:  Class III     INDICATIONS:  foreign body At attempted PEG removal base of     gastrostomy tube remained in stomach           MEDICATIONS:   Fentanyl 50 mcg, Versed 4 mg, glycopyrrolate     (Robinal) 0.2 mg     TOPICAL ANESTHETIC:  Cetacaine Spray           DESCRIPTION OF PROCEDURE:   After the risks benefits and     alternatives of the procedure were thoroughly explained, informed     consent was obtained.  The EG-[id number] ([id number]) endoscope was     introduced through the mouth and advanced to the second portion of     the duodenum, without limitations.  The instrument was slowly     withdrawn as the mucosa was fully examined.     <<PROCEDUREIMAGES>>           Candida esophagitis in the total esophagus (see image001).     Multiple adherent yellow-white plaques  A gastrostomy tube base     was found in the body of the stomach. G tube removed with rat     tooth forceps The foreign body was removed and sent to pathology     (see image003).  other findings. Gastrostomy tube site     Retroflexed views revealed no abnormalities.    The scope was then     withdrawn from the patient and the procedure completed.           COMPLICATIONS:  None           ENDOSCOPIC IMPRESSION:     1) Candida esophagitis in the total esophagus     2) Gastrostomy tube base in the body of the stomach - removed     3) Other findings  RECOMMENDATIONS:     1) flucanazole 100mg  qd     2) GI f/u prn           REPEAT EXAM:  No           ______________________________     Barbette Hair. Arlyce Dice, MD           CC:  Kari Baars, MD, Jethro Bolus MD           n.     REVISED:  06/17/2010 11:28 AM     eSIGNED:   Barbette Hair. Kaplan at 06/17/2010 11:28 AM           Dannielle Karvonen, [id number]  Note: An exclamation mark (!) indicates a result that was not dispersed into  the flowsheet. Document Creation Date: 06/17/2010 11:30 AM _______________________________________________________________________  (1) Order result status: Final Collection or observation date-time: 06/17/2010 11:09 Requested date-time:  Receipt date-time:  Reported date-time:  Referring Physician:   Ordering Physician: Melvia Heaps [phone]) Specimen Source:  Source: Launa Grill Order Number: [id number] Lab site:

## 2010-12-03 NOTE — Miscellaneous (Signed)
  Clinical Lists Changes  Medications: Added new medication of DIFLUCAN 100 MG TABS (FLUCONAZOLE) 1 tab once daily - Signed Rx of DIFLUCAN 100 MG TABS (FLUCONAZOLE) 1 tab once daily;  #10 x 0;  Signed;  Entered by: Louis Meckel MD;  Authorized by: Louis Meckel MD;  Method used: Electronically to [facility]*, [address] [place], [place]  [id number], Ph: [phone], Fax: [phone]    Prescriptions: DIFLUCAN 100 MG TABS (FLUCONAZOLE) 1 tab once daily  #10 x 0   Entered and Authorized by:   Louis Meckel MD   Signed by:   Louis Meckel MD on 06/17/2010   Method used:   Electronically to        [facility]* (retail)       [address]       [place], [place]  [id number]       Ph: [phone]       Fax: [phone]   RxID:   [id number]

## 2010-12-06 ENCOUNTER — Ambulatory Visit (HOSPITAL_BASED_OUTPATIENT_CLINIC_OR_DEPARTMENT_OTHER): Payer: MEDICARE | Admitting: Oncology

## 2010-12-06 DIAGNOSIS — C801 Malignant (primary) neoplasm, unspecified: Secondary | ICD-10-CM

## 2010-12-06 DIAGNOSIS — R21 Rash and other nonspecific skin eruption: Secondary | ICD-10-CM

## 2010-12-06 DIAGNOSIS — C321 Malignant neoplasm of supraglottis: Secondary | ICD-10-CM

## 2010-12-06 DIAGNOSIS — Z5111 Encounter for antineoplastic chemotherapy: Secondary | ICD-10-CM

## 2010-12-06 DIAGNOSIS — C7800 Secondary malignant neoplasm of unspecified lung: Secondary | ICD-10-CM

## 2010-12-06 DIAGNOSIS — Z5112 Encounter for antineoplastic immunotherapy: Secondary | ICD-10-CM

## 2010-12-06 LAB — COMPREHENSIVE METABOLIC PANEL
AST: 18 U/L (ref 0–37)
Albumin: 3.6 g/dL (ref 3.5–5.2)
BUN: 7 mg/dL (ref 6–23)
CO2: 25 mEq/L (ref 19–32)
Calcium: 8.5 mg/dL (ref 8.4–10.5)
Chloride: 103 mEq/L (ref 96–112)
Potassium: 3.8 mEq/L (ref 3.5–5.3)

## 2010-12-06 LAB — CBC WITH DIFFERENTIAL/PLATELET
BASO%: 0.3 % (ref 0.0–2.0)
Basophils Absolute: 0 10*3/uL (ref 0.0–0.1)
Eosinophils Absolute: 0 10*3/uL (ref 0.0–0.5)
HCT: 38.2 % — ABNORMAL LOW (ref 38.4–49.9)
HGB: 12.8 g/dL — ABNORMAL LOW (ref 13.0–17.1)
MCHC: 33.7 g/dL (ref 32.0–36.0)
MONO#: 0.3 10*3/uL (ref 0.1–0.9)
NEUT#: 5.1 10*3/uL (ref 1.5–6.5)
NEUT%: 73.8 % (ref 39.0–75.0)
Platelets: 179 10*3/uL (ref 140–400)
WBC: 7 10*3/uL (ref 4.0–10.3)
lymph#: 1.4 10*3/uL (ref 0.9–3.3)

## 2010-12-13 ENCOUNTER — Other Ambulatory Visit: Payer: Self-pay | Admitting: Medical

## 2010-12-13 ENCOUNTER — Encounter (HOSPITAL_BASED_OUTPATIENT_CLINIC_OR_DEPARTMENT_OTHER): Payer: MEDICARE | Admitting: Oncology

## 2010-12-13 ENCOUNTER — Other Ambulatory Visit: Payer: Self-pay | Admitting: Oncology

## 2010-12-13 DIAGNOSIS — Z5112 Encounter for antineoplastic immunotherapy: Secondary | ICD-10-CM

## 2010-12-13 DIAGNOSIS — C7800 Secondary malignant neoplasm of unspecified lung: Secondary | ICD-10-CM

## 2010-12-13 DIAGNOSIS — C321 Malignant neoplasm of supraglottis: Secondary | ICD-10-CM

## 2010-12-13 DIAGNOSIS — Z5111 Encounter for antineoplastic chemotherapy: Secondary | ICD-10-CM

## 2010-12-13 DIAGNOSIS — C801 Malignant (primary) neoplasm, unspecified: Secondary | ICD-10-CM

## 2010-12-13 DIAGNOSIS — T451X5A Adverse effect of antineoplastic and immunosuppressive drugs, initial encounter: Secondary | ICD-10-CM

## 2010-12-13 LAB — COMPREHENSIVE METABOLIC PANEL
ALT: 10 U/L (ref 0–53)
AST: 17 U/L (ref 0–37)
Calcium: 8.6 mg/dL (ref 8.4–10.5)
Chloride: 102 mEq/L (ref 96–112)
Creatinine, Ser: 0.61 mg/dL (ref 0.40–1.50)
Sodium: 135 mEq/L (ref 135–145)
Total Bilirubin: 0.4 mg/dL (ref 0.3–1.2)
Total Protein: 6.3 g/dL (ref 6.0–8.3)

## 2010-12-13 LAB — CBC WITH DIFFERENTIAL/PLATELET
BASO%: 1.1 % (ref 0.0–2.0)
Basophils Absolute: 0 10*3/uL (ref 0.0–0.1)
EOS%: 1.6 % (ref 0.0–7.0)
HGB: 12.3 g/dL — ABNORMAL LOW (ref 13.0–17.1)
MCH: 33 pg (ref 27.2–33.4)
MCHC: 34.3 g/dL (ref 32.0–36.0)
RDW: 15.5 % — ABNORMAL HIGH (ref 11.0–14.6)
WBC: 4 10*3/uL (ref 4.0–10.3)
lymph#: 1.1 10*3/uL (ref 0.9–3.3)

## 2010-12-18 ENCOUNTER — Emergency Department (HOSPITAL_COMMUNITY): Payer: MEDICARE

## 2010-12-18 ENCOUNTER — Emergency Department (HOSPITAL_COMMUNITY)
Admission: EM | Admit: 2010-12-18 | Discharge: 2010-12-18 | Disposition: A | Discharge status: Home / Self Care | Payer: MEDICARE | Attending: Emergency Medicine | Admitting: Emergency Medicine

## 2010-12-18 DIAGNOSIS — Z9221 Personal history of antineoplastic chemotherapy: Secondary | ICD-10-CM | POA: Insufficient documentation

## 2010-12-18 DIAGNOSIS — C3490 Malignant neoplasm of unspecified part of unspecified bronchus or lung: Secondary | ICD-10-CM | POA: Insufficient documentation

## 2010-12-18 DIAGNOSIS — Y92009 Unspecified place in unspecified non-institutional (private) residence as the place of occurrence of the external cause: Secondary | ICD-10-CM | POA: Insufficient documentation

## 2010-12-18 DIAGNOSIS — M79609 Pain in unspecified limb: Secondary | ICD-10-CM | POA: Insufficient documentation

## 2010-12-18 DIAGNOSIS — W19XXXA Unspecified fall, initial encounter: Secondary | ICD-10-CM | POA: Insufficient documentation

## 2010-12-18 LAB — DIFFERENTIAL
Eosinophils Absolute: 0 10*3/uL (ref 0.0–0.7)
Eosinophils Relative: 0 % (ref 0–5)
Lymphs Abs: 0.9 10*3/uL (ref 0.7–4.0)
Monocytes Relative: 5 % (ref 3–12)

## 2010-12-18 LAB — BASIC METABOLIC PANEL
BUN: 5 mg/dL — ABNORMAL LOW (ref 6–23)
Calcium: 8.6 mg/dL (ref 8.4–10.5)
GFR calc non Af Amer: >60 mL/min (ref >60)
Glucose, Bld: 85 mg/dL (ref 70–99)
Potassium: 4.2 mEq/L (ref 3.5–5.1)
Sodium: 138 mEq/L (ref 135–145)

## 2010-12-18 LAB — CBC
MCH: 31.4 pg (ref 26.0–34.0)
MCV: 94.8 fL (ref 78.0–100.0)
Platelets: 157 10*3/uL (ref 150–400)
RDW: 14.7 % (ref 11.5–15.5)

## 2010-12-20 ENCOUNTER — Other Ambulatory Visit: Payer: Self-pay | Admitting: Oncology

## 2010-12-20 ENCOUNTER — Encounter (HOSPITAL_BASED_OUTPATIENT_CLINIC_OR_DEPARTMENT_OTHER): Payer: MEDICARE | Admitting: Oncology

## 2010-12-20 DIAGNOSIS — C801 Malignant (primary) neoplasm, unspecified: Secondary | ICD-10-CM

## 2010-12-20 DIAGNOSIS — C7800 Secondary malignant neoplasm of unspecified lung: Secondary | ICD-10-CM

## 2010-12-20 DIAGNOSIS — C321 Malignant neoplasm of supraglottis: Secondary | ICD-10-CM

## 2010-12-20 LAB — COMPREHENSIVE METABOLIC PANEL
ALT: 11 U/L (ref 0–53)
AST: 17 U/L (ref 0–37)
Albumin: 3.4 g/dL — ABNORMAL LOW (ref 3.5–5.2)
CO2: 24 mEq/L (ref 19–32)
Calcium: 8.1 mg/dL — ABNORMAL LOW (ref 8.4–10.5)
Chloride: 104 mEq/L (ref 96–112)
Potassium: 3.9 mEq/L (ref 3.5–5.3)
Total Protein: 5.8 g/dL — ABNORMAL LOW (ref 6.0–8.3)

## 2010-12-20 LAB — CBC WITH DIFFERENTIAL/PLATELET
BASO%: 0.5 % (ref 0.0–2.0)
EOS%: 0.5 % (ref 0.0–7.0)
HGB: 12.8 g/dL — ABNORMAL LOW (ref 13.0–17.1)
MCH: 33 pg (ref 27.2–33.4)
MCHC: 34.1 g/dL (ref 32.0–36.0)
MONO#: 0.3 10*3/uL (ref 0.1–0.9)
RDW: 15.5 % — ABNORMAL HIGH (ref 11.0–14.6)
WBC: 4.7 10*3/uL (ref 4.0–10.3)
lymph#: 1.3 10*3/uL (ref 0.9–3.3)

## 2010-12-20 LAB — MAGNESIUM: Magnesium: 1.4 mg/dL — ABNORMAL LOW (ref 1.5–2.5)

## 2010-12-30 ENCOUNTER — Encounter (HOSPITAL_BASED_OUTPATIENT_CLINIC_OR_DEPARTMENT_OTHER): Payer: MEDICARE | Attending: Internal Medicine

## 2010-12-30 DIAGNOSIS — K121 Other forms of stomatitis: Secondary | ICD-10-CM | POA: Insufficient documentation

## 2010-12-30 DIAGNOSIS — F17200 Nicotine dependence, unspecified, uncomplicated: Secondary | ICD-10-CM | POA: Insufficient documentation

## 2010-12-30 DIAGNOSIS — Z8589 Personal history of malignant neoplasm of other organs and systems: Secondary | ICD-10-CM | POA: Insufficient documentation

## 2010-12-30 DIAGNOSIS — E46 Unspecified protein-calorie malnutrition: Secondary | ICD-10-CM | POA: Insufficient documentation

## 2010-12-30 DIAGNOSIS — M479 Spondylosis, unspecified: Secondary | ICD-10-CM | POA: Insufficient documentation

## 2010-12-30 DIAGNOSIS — M47812 Spondylosis without myelopathy or radiculopathy, cervical region: Secondary | ICD-10-CM | POA: Insufficient documentation

## 2010-12-30 DIAGNOSIS — Z79899 Other long term (current) drug therapy: Secondary | ICD-10-CM | POA: Insufficient documentation

## 2010-12-30 DIAGNOSIS — C3490 Malignant neoplasm of unspecified part of unspecified bronchus or lung: Secondary | ICD-10-CM | POA: Insufficient documentation

## 2010-12-30 DIAGNOSIS — J4489 Other specified chronic obstructive pulmonary disease: Secondary | ICD-10-CM | POA: Insufficient documentation

## 2010-12-30 DIAGNOSIS — J449 Chronic obstructive pulmonary disease, unspecified: Secondary | ICD-10-CM | POA: Insufficient documentation

## 2010-12-30 DIAGNOSIS — R29898 Other symptoms and signs involving the musculoskeletal system: Secondary | ICD-10-CM | POA: Insufficient documentation

## 2010-12-30 DIAGNOSIS — M79609 Pain in unspecified limb: Secondary | ICD-10-CM | POA: Insufficient documentation

## 2010-12-30 DIAGNOSIS — L988 Other specified disorders of the skin and subcutaneous tissue: Secondary | ICD-10-CM | POA: Insufficient documentation

## 2010-12-30 DIAGNOSIS — K219 Gastro-esophageal reflux disease without esophagitis: Secondary | ICD-10-CM | POA: Insufficient documentation

## 2011-01-02 ENCOUNTER — Encounter (HOSPITAL_COMMUNITY): Payer: Self-pay

## 2011-01-02 ENCOUNTER — Ambulatory Visit (HOSPITAL_COMMUNITY)
Admission: RE | Admit: 2011-01-02 | Discharge: 2011-01-02 | Disposition: A | Discharge status: Home / Self Care | Payer: MEDICARE | Source: Ambulatory Visit | Attending: Oncology | Admitting: Oncology

## 2011-01-02 DIAGNOSIS — Z923 Personal history of irradiation: Secondary | ICD-10-CM | POA: Insufficient documentation

## 2011-01-02 DIAGNOSIS — R059 Cough, unspecified: Secondary | ICD-10-CM | POA: Insufficient documentation

## 2011-01-02 DIAGNOSIS — C787 Secondary malignant neoplasm of liver and intrahepatic bile duct: Secondary | ICD-10-CM | POA: Insufficient documentation

## 2011-01-02 DIAGNOSIS — J3489 Other specified disorders of nose and nasal sinuses: Secondary | ICD-10-CM | POA: Insufficient documentation

## 2011-01-02 DIAGNOSIS — Z9221 Personal history of antineoplastic chemotherapy: Secondary | ICD-10-CM | POA: Insufficient documentation

## 2011-01-02 DIAGNOSIS — C321 Malignant neoplasm of supraglottis: Secondary | ICD-10-CM

## 2011-01-02 DIAGNOSIS — R05 Cough: Secondary | ICD-10-CM | POA: Insufficient documentation

## 2011-01-02 DIAGNOSIS — C329 Malignant neoplasm of larynx, unspecified: Secondary | ICD-10-CM | POA: Insufficient documentation

## 2011-01-02 DIAGNOSIS — C7800 Secondary malignant neoplasm of unspecified lung: Secondary | ICD-10-CM | POA: Insufficient documentation

## 2011-01-02 HISTORY — DX: Malignant (primary) neoplasm, unspecified: C80.1

## 2011-01-02 MED ORDER — IOHEXOL 300 MG/ML  SOLN
100.0000 mL | Freq: Once | INTRAMUSCULAR | Status: AC | PRN
  Administered 2011-01-02: 100 mL via INTRAVENOUS

## 2011-01-03 ENCOUNTER — Other Ambulatory Visit: Payer: Self-pay | Admitting: Oncology

## 2011-01-03 ENCOUNTER — Encounter (HOSPITAL_BASED_OUTPATIENT_CLINIC_OR_DEPARTMENT_OTHER): Payer: MEDICARE | Admitting: Oncology

## 2011-01-03 DIAGNOSIS — Z5112 Encounter for antineoplastic immunotherapy: Secondary | ICD-10-CM

## 2011-01-03 DIAGNOSIS — C787 Secondary malignant neoplasm of liver and intrahepatic bile duct: Secondary | ICD-10-CM

## 2011-01-03 DIAGNOSIS — C321 Malignant neoplasm of supraglottis: Secondary | ICD-10-CM

## 2011-01-03 DIAGNOSIS — F17200 Nicotine dependence, unspecified, uncomplicated: Secondary | ICD-10-CM

## 2011-01-03 DIAGNOSIS — Z5111 Encounter for antineoplastic chemotherapy: Secondary | ICD-10-CM

## 2011-01-03 DIAGNOSIS — C801 Malignant (primary) neoplasm, unspecified: Secondary | ICD-10-CM

## 2011-01-03 DIAGNOSIS — C7800 Secondary malignant neoplasm of unspecified lung: Secondary | ICD-10-CM

## 2011-01-03 LAB — CBC WITH DIFFERENTIAL/PLATELET
BASO%: 0.3 % (ref 0.0–2.0)
EOS%: 0.6 % (ref 0.0–7.0)
LYMPH%: 25.9 % (ref 14.0–49.0)
MCH: 31.9 pg (ref 27.2–33.4)
MCHC: 33.5 g/dL (ref 32.0–36.0)
MCV: 95.2 fL (ref 79.3–98.0)
MONO#: 0.7 10*3/uL (ref 0.1–0.9)
MONO%: 10.7 % (ref 0.0–14.0)
NEUT%: 62.5 % (ref 39.0–75.0)
Platelets: 176 10*3/uL (ref 140–400)
RBC: 4.2 10*6/uL (ref 4.20–5.82)
WBC: 6.8 10*3/uL (ref 4.0–10.3)

## 2011-01-06 ENCOUNTER — Encounter (HOSPITAL_BASED_OUTPATIENT_CLINIC_OR_DEPARTMENT_OTHER): Payer: MEDICARE | Attending: Internal Medicine

## 2011-01-06 DIAGNOSIS — C3490 Malignant neoplasm of unspecified part of unspecified bronchus or lung: Secondary | ICD-10-CM | POA: Insufficient documentation

## 2011-01-06 DIAGNOSIS — K219 Gastro-esophageal reflux disease without esophagitis: Secondary | ICD-10-CM | POA: Insufficient documentation

## 2011-01-06 DIAGNOSIS — J449 Chronic obstructive pulmonary disease, unspecified: Secondary | ICD-10-CM | POA: Insufficient documentation

## 2011-01-06 DIAGNOSIS — M79609 Pain in unspecified limb: Secondary | ICD-10-CM | POA: Insufficient documentation

## 2011-01-06 DIAGNOSIS — K121 Other forms of stomatitis: Secondary | ICD-10-CM | POA: Insufficient documentation

## 2011-01-06 DIAGNOSIS — M479 Spondylosis, unspecified: Secondary | ICD-10-CM | POA: Insufficient documentation

## 2011-01-06 DIAGNOSIS — M47812 Spondylosis without myelopathy or radiculopathy, cervical region: Secondary | ICD-10-CM | POA: Insufficient documentation

## 2011-01-06 DIAGNOSIS — Z79899 Other long term (current) drug therapy: Secondary | ICD-10-CM | POA: Insufficient documentation

## 2011-01-06 DIAGNOSIS — Z8589 Personal history of malignant neoplasm of other organs and systems: Secondary | ICD-10-CM | POA: Insufficient documentation

## 2011-01-06 DIAGNOSIS — E46 Unspecified protein-calorie malnutrition: Secondary | ICD-10-CM | POA: Insufficient documentation

## 2011-01-06 DIAGNOSIS — R29898 Other symptoms and signs involving the musculoskeletal system: Secondary | ICD-10-CM | POA: Insufficient documentation

## 2011-01-06 DIAGNOSIS — L988 Other specified disorders of the skin and subcutaneous tissue: Secondary | ICD-10-CM | POA: Insufficient documentation

## 2011-01-06 DIAGNOSIS — F17200 Nicotine dependence, unspecified, uncomplicated: Secondary | ICD-10-CM | POA: Insufficient documentation

## 2011-01-06 DIAGNOSIS — J4489 Other specified chronic obstructive pulmonary disease: Secondary | ICD-10-CM | POA: Insufficient documentation

## 2011-01-06 DIAGNOSIS — K1230 Oral mucositis (ulcerative), unspecified: Secondary | ICD-10-CM | POA: Insufficient documentation

## 2011-01-07 ENCOUNTER — Encounter (INDEPENDENT_AMBULATORY_CARE_PROVIDER_SITE_OTHER): Payer: MEDICARE

## 2011-01-07 DIAGNOSIS — L97409 Non-pressure chronic ulcer of unspecified heel and midfoot with unspecified severity: Secondary | ICD-10-CM

## 2011-01-17 LAB — GLUCOSE, CAPILLARY: Glucose-Capillary: 85 mg/dL (ref 70–99)

## 2011-01-18 LAB — PROTIME-INR
INR: 1.09 (ref 0.00–1.49)
Prothrombin Time: 14 seconds (ref 11.6–15.2)

## 2011-01-18 LAB — CBC
MCH: 32.9 pg (ref 26.0–34.0)
MCHC: 34.8 g/dL (ref 30.0–36.0)
Platelets: 204 10*3/uL (ref 150–400)
RDW: 12.6 % (ref 11.5–15.5)

## 2011-01-19 LAB — CBC
MCHC: 34 g/dL (ref 30.0–36.0)
MCV: 97.7 fL (ref 78.0–100.0)
RDW: 13.7 % (ref 11.5–15.5)

## 2011-01-19 LAB — COMPREHENSIVE METABOLIC PANEL
ALT: 15 U/L (ref 0–53)
AST: 22 U/L (ref 0–37)
Alkaline Phosphatase: 120 U/L — ABNORMAL HIGH (ref 39–117)
Calcium: 8.6 mg/dL (ref 8.4–10.5)
GFR calc Af Amer: >60 mL/min (ref >60)
Potassium: 4.1 mEq/L (ref 3.5–5.1)
Sodium: 137 mEq/L (ref 135–145)
Total Protein: 6.6 g/dL (ref 6.0–8.3)

## 2011-01-24 ENCOUNTER — Ambulatory Visit: Payer: MEDICARE | Attending: Radiation Oncology | Admitting: Radiation Oncology

## 2011-01-24 LAB — BASIC METABOLIC PANEL
BUN: 13 mg/dL (ref 6–23)
CO2: 30 mEq/L (ref 19–32)
Chloride: 102 mEq/L (ref 96–112)
Creatinine, Ser: 0.74 mg/dL (ref 0.4–1.5)
Glucose, Bld: 106 mg/dL — ABNORMAL HIGH (ref 70–99)

## 2011-01-24 LAB — PROTIME-INR
INR: 0.96 (ref 0.00–1.49)
Prothrombin Time: 12.7 seconds (ref 11.6–15.2)

## 2011-01-24 LAB — DIFFERENTIAL
Basophils Absolute: 0 10*3/uL (ref 0.0–0.1)
Basophils Relative: 0 % (ref 0–1)
Eosinophils Absolute: 0 10*3/uL (ref 0.0–0.7)
Monocytes Relative: 10 % (ref 3–12)
Neutrophils Relative %: 81 % — ABNORMAL HIGH (ref 43–77)

## 2011-01-24 LAB — CBC
MCHC: 34 g/dL (ref 30.0–36.0)
MCHC: 34.1 g/dL (ref 30.0–36.0)
MCV: 96.7 fL (ref 78.0–100.0)
Platelets: 193 10*3/uL (ref 150–400)
RBC: 4.4 MIL/uL (ref 4.22–5.81)
RDW: 13.6 % (ref 11.5–15.5)
WBC: 6.1 10*3/uL (ref 4.0–10.5)

## 2011-01-24 LAB — APTT: aPTT: 31 seconds (ref 24–37)

## 2011-02-03 ENCOUNTER — Other Ambulatory Visit: Payer: Self-pay | Admitting: Oncology

## 2011-02-03 ENCOUNTER — Encounter (HOSPITAL_BASED_OUTPATIENT_CLINIC_OR_DEPARTMENT_OTHER): Payer: MEDICARE | Admitting: Oncology

## 2011-02-03 DIAGNOSIS — F17200 Nicotine dependence, unspecified, uncomplicated: Secondary | ICD-10-CM

## 2011-02-03 DIAGNOSIS — C801 Malignant (primary) neoplasm, unspecified: Secondary | ICD-10-CM

## 2011-02-03 DIAGNOSIS — C229 Malignant neoplasm of liver, not specified as primary or secondary: Secondary | ICD-10-CM

## 2011-02-03 DIAGNOSIS — C787 Secondary malignant neoplasm of liver and intrahepatic bile duct: Secondary | ICD-10-CM

## 2011-02-03 DIAGNOSIS — C7800 Secondary malignant neoplasm of unspecified lung: Secondary | ICD-10-CM

## 2011-02-03 DIAGNOSIS — C321 Malignant neoplasm of supraglottis: Secondary | ICD-10-CM

## 2011-02-03 DIAGNOSIS — C3490 Malignant neoplasm of unspecified part of unspecified bronchus or lung: Secondary | ICD-10-CM

## 2011-02-03 LAB — CBC WITH DIFFERENTIAL/PLATELET
EOS%: 1.7 % (ref 0.0–7.0)
Eosinophils Absolute: 0.1 10*3/uL (ref 0.0–0.5)
MCH: 31.5 pg (ref 27.2–33.4)
MCV: 95.2 fL (ref 79.3–98.0)
MONO%: 11.6 % (ref 0.0–14.0)
NEUT#: 5.3 10*3/uL (ref 1.5–6.5)
RBC: 4.05 10*6/uL — ABNORMAL LOW (ref 4.20–5.82)
RDW: 16.2 % — ABNORMAL HIGH (ref 11.0–14.6)

## 2011-02-03 LAB — COMPREHENSIVE METABOLIC PANEL
AST: 16 U/L (ref 0–37)
Albumin: 3.6 g/dL (ref 3.5–5.2)
Alkaline Phosphatase: 176 U/L — ABNORMAL HIGH (ref 39–117)
Potassium: 3.8 mEq/L (ref 3.5–5.3)
Sodium: 137 mEq/L (ref 135–145)
Total Protein: 6.7 g/dL (ref 6.0–8.3)

## 2011-02-10 ENCOUNTER — Encounter (HOSPITAL_BASED_OUTPATIENT_CLINIC_OR_DEPARTMENT_OTHER): Payer: MEDICARE | Attending: Internal Medicine

## 2011-02-10 LAB — URINALYSIS, ROUTINE W REFLEX MICROSCOPIC
Glucose, UA: NEGATIVE mg/dL
Nitrite: NEGATIVE
Protein, ur: NEGATIVE mg/dL
pH: 8 (ref 5.0–8.0)

## 2011-02-10 LAB — ANAEROBIC CULTURE

## 2011-02-10 LAB — FUNGUS CULTURE W SMEAR: Fungal Smear: NONE SEEN

## 2011-02-10 LAB — CBC
HCT: 40 % (ref 39.0–52.0)
HCT: 42.9 % (ref 39.0–52.0)
Hemoglobin: 11.4 g/dL — ABNORMAL LOW (ref 13.0–17.0)
Hemoglobin: 13.5 g/dL (ref 13.0–17.0)
MCHC: 33.7 g/dL (ref 30.0–36.0)
MCHC: 34 g/dL (ref 30.0–36.0)
MCHC: 34 g/dL (ref 30.0–36.0)
MCV: 92.2 fL (ref 78.0–100.0)
MCV: 92.3 fL (ref 78.0–100.0)
Platelets: 295 10*3/uL (ref 150–400)
Platelets: 303 10*3/uL (ref 150–400)
RBC: 3.67 MIL/uL — ABNORMAL LOW (ref 4.22–5.81)
RBC: 4 MIL/uL — ABNORMAL LOW (ref 4.22–5.81)
RBC: 4.43 MIL/uL (ref 4.22–5.81)
RBC: 4.65 MIL/uL (ref 4.22–5.81)
RDW: 12.9 % (ref 11.5–15.5)
WBC: 11.4 10*3/uL — ABNORMAL HIGH (ref 4.0–10.5)
WBC: 8.3 10*3/uL (ref 4.0–10.5)

## 2011-02-10 LAB — DIFFERENTIAL
Basophils Absolute: 0.1 10*3/uL (ref 0.0–0.1)
Basophils Relative: 1 % (ref 0–1)
Eosinophils Relative: 0 % (ref 0–5)
Monocytes Absolute: 0.8 10*3/uL (ref 0.1–1.0)
Monocytes Relative: 5 % (ref 3–12)

## 2011-02-10 LAB — COMPREHENSIVE METABOLIC PANEL
ALT: UNDETERMINED U/L (ref 0–53)
AST: UNDETERMINED U/L (ref 0–37)
Albumin: 2.4 g/dL — ABNORMAL LOW (ref 3.5–5.2)
Alkaline Phosphatase: 75 U/L (ref 39–117)
BUN: 10 mg/dL (ref 6–23)
CO2: 29 mEq/L (ref 19–32)
Chloride: 111 mEq/L (ref 96–112)
Chloride: 102 mEq/L (ref 96–112)
Creatinine, Ser: 0.96 mg/dL (ref 0.4–1.5)
Creatinine, Ser: 1 mg/dL (ref 0.4–1.5)
GFR calc Af Amer: >60 mL/min (ref >60)
GFR calc non Af Amer: >60 mL/min (ref >60)
Potassium: 4.9 mEq/L (ref 3.5–5.1)
Sodium: 136 mEq/L (ref 135–145)
Total Bilirubin: UNDETERMINED mg/dL (ref 0.3–1.2)
Total Bilirubin: 0.4 mg/dL (ref 0.3–1.2)

## 2011-02-10 LAB — BASIC METABOLIC PANEL
CO2: 24 mEq/L (ref 19–32)
Calcium: 7.8 mg/dL — ABNORMAL LOW (ref 8.4–10.5)
Calcium: 8.9 mg/dL (ref 8.4–10.5)
GFR calc Af Amer: >60 mL/min (ref >60)
GFR calc Af Amer: >60 mL/min (ref >60)
GFR calc non Af Amer: >60 mL/min (ref >60)
GFR calc non Af Amer: >60 mL/min (ref >60)
Potassium: 3.3 mEq/L — ABNORMAL LOW (ref 3.5–5.1)
Potassium: 4.4 mEq/L (ref 3.5–5.1)
Sodium: 136 mEq/L (ref 135–145)
Sodium: 136 mEq/L (ref 135–145)

## 2011-02-10 LAB — CULTURE, BLOOD (ROUTINE X 2): Culture: NO GROWTH

## 2011-02-10 LAB — BLOOD GAS, ARTERIAL
Bicarbonate: 22.6 mEq/L (ref 20.0–24.0)
Patient temperature: 99.3
TCO2: 19.6 mmol/L (ref 0–100)
pH, Arterial: 7.451 — ABNORMAL HIGH (ref 7.350–7.450)
pO2, Arterial: 68.6 mmHg — ABNORMAL LOW (ref 80.0–100.0)

## 2011-02-10 LAB — BRAIN NATRIURETIC PEPTIDE: Pro B Natriuretic peptide (BNP): 55 pg/mL (ref 0.0–100.0)

## 2011-02-10 LAB — URINE CULTURE: Culture: NO GROWTH

## 2011-02-10 LAB — PROTIME-INR: Prothrombin Time: 14.2 seconds (ref 11.6–15.2)

## 2011-02-10 LAB — AST: AST: 23 U/L (ref 0–37)

## 2011-02-10 LAB — BODY FLUID CULTURE

## 2011-02-11 ENCOUNTER — Encounter (HOSPITAL_BASED_OUTPATIENT_CLINIC_OR_DEPARTMENT_OTHER): Payer: MEDICARE | Admitting: Oncology

## 2011-02-11 ENCOUNTER — Other Ambulatory Visit: Payer: Self-pay | Admitting: Oncology

## 2011-02-11 DIAGNOSIS — F17200 Nicotine dependence, unspecified, uncomplicated: Secondary | ICD-10-CM

## 2011-02-11 DIAGNOSIS — C787 Secondary malignant neoplasm of liver and intrahepatic bile duct: Secondary | ICD-10-CM

## 2011-02-11 DIAGNOSIS — C801 Malignant (primary) neoplasm, unspecified: Secondary | ICD-10-CM

## 2011-02-11 DIAGNOSIS — C321 Malignant neoplasm of supraglottis: Secondary | ICD-10-CM

## 2011-02-11 DIAGNOSIS — C7800 Secondary malignant neoplasm of unspecified lung: Secondary | ICD-10-CM

## 2011-02-11 LAB — CBC WITH DIFFERENTIAL/PLATELET
Basophils Absolute: 0 10*3/uL (ref 0.0–0.1)
EOS%: 0.6 % (ref 0.0–7.0)
Eosinophils Absolute: 0.1 10*3/uL (ref 0.0–0.5)
HGB: 13.3 g/dL (ref 13.0–17.1)
NEUT#: 9 10*3/uL — ABNORMAL HIGH (ref 1.5–6.5)
RDW: 14.9 % — ABNORMAL HIGH (ref 11.0–14.6)
lymph#: 2.1 10*3/uL (ref 0.9–3.3)

## 2011-02-11 LAB — COMPREHENSIVE METABOLIC PANEL
Albumin: 3.6 g/dL (ref 3.5–5.2)
Alkaline Phosphatase: 191 U/L — ABNORMAL HIGH (ref 39–117)
CO2: 22 mEq/L (ref 19–32)
Glucose, Bld: 103 mg/dL — ABNORMAL HIGH (ref 70–99)
Potassium: 3.9 mEq/L (ref 3.5–5.3)
Sodium: 136 mEq/L (ref 135–145)
Total Protein: 6.6 g/dL (ref 6.0–8.3)

## 2011-02-13 ENCOUNTER — Encounter (HOSPITAL_COMMUNITY)
Admission: RE | Admit: 2011-02-13 | Discharge: 2011-02-13 | Disposition: A | Discharge status: Home / Self Care | Payer: MEDICARE | Source: Ambulatory Visit | Attending: Oncology | Admitting: Oncology

## 2011-02-13 ENCOUNTER — Encounter (HOSPITAL_COMMUNITY): Payer: Self-pay

## 2011-02-13 ENCOUNTER — Other Ambulatory Visit (HOSPITAL_COMMUNITY): Payer: Self-pay | Admitting: Diagnostic Radiology

## 2011-02-13 ENCOUNTER — Ambulatory Visit (HOSPITAL_COMMUNITY)
Admission: RE | Admit: 2011-02-13 | Discharge: 2011-02-13 | Disposition: A | Discharge status: Home / Self Care | Payer: MEDICARE | Source: Ambulatory Visit | Attending: Diagnostic Radiology | Admitting: Diagnostic Radiology

## 2011-02-13 DIAGNOSIS — C3490 Malignant neoplasm of unspecified part of unspecified bronchus or lung: Secondary | ICD-10-CM

## 2011-02-13 DIAGNOSIS — M25559 Pain in unspecified hip: Secondary | ICD-10-CM | POA: Insufficient documentation

## 2011-02-13 DIAGNOSIS — M47817 Spondylosis without myelopathy or radiculopathy, lumbosacral region: Secondary | ICD-10-CM | POA: Insufficient documentation

## 2011-02-13 DIAGNOSIS — W19XXXA Unspecified fall, initial encounter: Secondary | ICD-10-CM

## 2011-02-13 DIAGNOSIS — Z8505 Personal history of malignant neoplasm of liver: Secondary | ICD-10-CM | POA: Insufficient documentation

## 2011-02-13 DIAGNOSIS — Z85118 Personal history of other malignant neoplasm of bronchus and lung: Secondary | ICD-10-CM | POA: Insufficient documentation

## 2011-02-13 DIAGNOSIS — C229 Malignant neoplasm of liver, not specified as primary or secondary: Secondary | ICD-10-CM

## 2011-02-13 DIAGNOSIS — Z8521 Personal history of malignant neoplasm of larynx: Secondary | ICD-10-CM | POA: Insufficient documentation

## 2011-02-13 MED ORDER — TECHNETIUM TC 99M MEDRONATE IV KIT
22.9000 | PACK | Freq: Once | INTRAVENOUS | Status: AC | PRN
  Administered 2011-02-13: 22.9 via INTRAVENOUS

## 2011-02-15 ENCOUNTER — Encounter (HOSPITAL_BASED_OUTPATIENT_CLINIC_OR_DEPARTMENT_OTHER): Payer: MEDICARE | Admitting: Oncology

## 2011-02-15 DIAGNOSIS — C321 Malignant neoplasm of supraglottis: Secondary | ICD-10-CM

## 2011-02-15 DIAGNOSIS — C7800 Secondary malignant neoplasm of unspecified lung: Secondary | ICD-10-CM

## 2011-02-15 DIAGNOSIS — C801 Malignant (primary) neoplasm, unspecified: Secondary | ICD-10-CM

## 2011-02-15 DIAGNOSIS — C787 Secondary malignant neoplasm of liver and intrahepatic bile duct: Secondary | ICD-10-CM

## 2011-02-17 ENCOUNTER — Other Ambulatory Visit: Payer: Self-pay | Admitting: Oncology

## 2011-02-17 ENCOUNTER — Encounter (HOSPITAL_BASED_OUTPATIENT_CLINIC_OR_DEPARTMENT_OTHER): Payer: MEDICARE | Admitting: Oncology

## 2011-02-17 DIAGNOSIS — C7800 Secondary malignant neoplasm of unspecified lung: Secondary | ICD-10-CM

## 2011-02-17 DIAGNOSIS — C801 Malignant (primary) neoplasm, unspecified: Secondary | ICD-10-CM

## 2011-02-17 DIAGNOSIS — F17200 Nicotine dependence, unspecified, uncomplicated: Secondary | ICD-10-CM

## 2011-02-17 DIAGNOSIS — C787 Secondary malignant neoplasm of liver and intrahepatic bile duct: Secondary | ICD-10-CM

## 2011-02-17 DIAGNOSIS — C321 Malignant neoplasm of supraglottis: Secondary | ICD-10-CM

## 2011-02-17 DIAGNOSIS — M25551 Pain in right hip: Secondary | ICD-10-CM

## 2011-02-17 LAB — BASIC METABOLIC PANEL
Calcium: 8.7 mg/dL (ref 8.4–10.5)
Creatinine, Ser: 0.84 mg/dL (ref 0.40–1.50)
Glucose, Bld: 124 mg/dL — ABNORMAL HIGH (ref 70–99)
Sodium: 136 mEq/L (ref 135–145)

## 2011-02-17 LAB — GLUCOSE, CAPILLARY: Glucose-Capillary: 93 mg/dL (ref 70–99)

## 2011-02-18 ENCOUNTER — Other Ambulatory Visit: Payer: Self-pay | Admitting: Oncology

## 2011-02-18 ENCOUNTER — Ambulatory Visit (HOSPITAL_COMMUNITY)
Admission: RE | Admit: 2011-02-18 | Discharge: 2011-02-18 | Disposition: A | Discharge status: Home / Self Care | Payer: MEDICARE | Source: Ambulatory Visit | Attending: Oncology | Admitting: Oncology

## 2011-02-18 ENCOUNTER — Ambulatory Visit (HOSPITAL_COMMUNITY): Payer: MEDICARE

## 2011-02-18 DIAGNOSIS — J4489 Other specified chronic obstructive pulmonary disease: Secondary | ICD-10-CM | POA: Insufficient documentation

## 2011-02-18 DIAGNOSIS — R633 Feeding difficulties, unspecified: Secondary | ICD-10-CM | POA: Insufficient documentation

## 2011-02-18 DIAGNOSIS — J449 Chronic obstructive pulmonary disease, unspecified: Secondary | ICD-10-CM | POA: Insufficient documentation

## 2011-02-18 DIAGNOSIS — R0602 Shortness of breath: Secondary | ICD-10-CM | POA: Insufficient documentation

## 2011-02-18 DIAGNOSIS — D649 Anemia, unspecified: Secondary | ICD-10-CM | POA: Insufficient documentation

## 2011-02-18 DIAGNOSIS — C3490 Malignant neoplasm of unspecified part of unspecified bronchus or lung: Secondary | ICD-10-CM

## 2011-02-18 DIAGNOSIS — E860 Dehydration: Secondary | ICD-10-CM | POA: Insufficient documentation

## 2011-02-18 DIAGNOSIS — I10 Essential (primary) hypertension: Secondary | ICD-10-CM | POA: Insufficient documentation

## 2011-02-18 DIAGNOSIS — K121 Other forms of stomatitis: Secondary | ICD-10-CM | POA: Insufficient documentation

## 2011-02-18 DIAGNOSIS — J984 Other disorders of lung: Secondary | ICD-10-CM | POA: Insufficient documentation

## 2011-02-18 DIAGNOSIS — C801 Malignant (primary) neoplasm, unspecified: Secondary | ICD-10-CM | POA: Insufficient documentation

## 2011-02-18 DIAGNOSIS — Z79899 Other long term (current) drug therapy: Secondary | ICD-10-CM | POA: Insufficient documentation

## 2011-02-18 LAB — CBC
Hemoglobin: 15.8 g/dL (ref 13.0–17.0)
Platelets: 256 10*3/uL (ref 150–400)
Platelets: 188 10*3/uL (ref 150–400)
RDW: 13.6 % (ref 11.5–15.5)
RDW: 14.6 % (ref 11.5–15.5)
WBC: 7.8 10*3/uL (ref 4.0–10.5)

## 2011-02-18 LAB — PROTIME-INR
INR: 1 (ref 0.00–1.49)
Prothrombin Time: 12.8 seconds (ref 11.6–15.2)

## 2011-02-18 LAB — BRAIN NATRIURETIC PEPTIDE: Pro B Natriuretic peptide (BNP): 56.6 pg/mL (ref 0.0–100.0)

## 2011-02-19 ENCOUNTER — Encounter (HOSPITAL_BASED_OUTPATIENT_CLINIC_OR_DEPARTMENT_OTHER): Payer: MEDICARE | Admitting: Oncology

## 2011-02-19 DIAGNOSIS — C321 Malignant neoplasm of supraglottis: Secondary | ICD-10-CM

## 2011-02-19 DIAGNOSIS — C7800 Secondary malignant neoplasm of unspecified lung: Secondary | ICD-10-CM

## 2011-02-19 DIAGNOSIS — R630 Anorexia: Secondary | ICD-10-CM

## 2011-02-19 DIAGNOSIS — C787 Secondary malignant neoplasm of liver and intrahepatic bile duct: Secondary | ICD-10-CM

## 2011-02-19 DIAGNOSIS — C801 Malignant (primary) neoplasm, unspecified: Secondary | ICD-10-CM

## 2011-02-21 ENCOUNTER — Ambulatory Visit (HOSPITAL_COMMUNITY)
Admission: RE | Admit: 2011-02-21 | Discharge: 2011-02-21 | Disposition: A | Discharge status: Home / Self Care | Payer: MEDICARE | Source: Ambulatory Visit | Attending: Oncology | Admitting: Oncology

## 2011-02-21 DIAGNOSIS — M25559 Pain in unspecified hip: Secondary | ICD-10-CM | POA: Insufficient documentation

## 2011-02-21 DIAGNOSIS — C7951 Secondary malignant neoplasm of bone: Secondary | ICD-10-CM | POA: Insufficient documentation

## 2011-02-21 DIAGNOSIS — M47817 Spondylosis without myelopathy or radiculopathy, lumbosacral region: Secondary | ICD-10-CM | POA: Insufficient documentation

## 2011-02-21 DIAGNOSIS — M79609 Pain in unspecified limb: Secondary | ICD-10-CM | POA: Insufficient documentation

## 2011-02-21 DIAGNOSIS — R948 Abnormal results of function studies of other organs and systems: Secondary | ICD-10-CM | POA: Insufficient documentation

## 2011-02-21 DIAGNOSIS — M4120 Other idiopathic scoliosis, site unspecified: Secondary | ICD-10-CM | POA: Insufficient documentation

## 2011-02-21 DIAGNOSIS — Z8521 Personal history of malignant neoplasm of larynx: Secondary | ICD-10-CM | POA: Insufficient documentation

## 2011-02-21 DIAGNOSIS — C7952 Secondary malignant neoplasm of bone marrow: Secondary | ICD-10-CM | POA: Insufficient documentation

## 2011-02-21 DIAGNOSIS — M25551 Pain in right hip: Secondary | ICD-10-CM

## 2011-02-21 MED ORDER — GADOBENATE DIMEGLUMINE 529 MG/ML IV SOLN
10.0000 mL | Freq: Once | INTRAVENOUS | Status: AC | PRN
  Administered 2011-02-21: 10 mL via INTRAVENOUS

## 2011-02-24 ENCOUNTER — Other Ambulatory Visit (HOSPITAL_COMMUNITY): Payer: MEDICARE

## 2011-03-04 DEATH — deceased

## 2011-03-11 IMAGING — CT CT HEAD WO/W CM
1 of 2 series · 13 of 30 positions shown, 17 images · IV contrast (agent unspecified)
Comparison: CT 09/23/2004

CLINICAL DATA: Staging for lung cancer.

CT HEAD WITHOUT AND WITH CONTRAST
TECHNIQUE: Contiguous axial images were obtained from the base of
the skull through the vertex without and with intravenous contrast.
Contrast: 100 ml Hmnipaque-SMM IV

[Series 2: head_seq 4.5 h37s st · axial · 0.43mm/px · z∈[-92,+25]mm · 13 of 32 slices shown, 17 images]
[im 3/32  brain]
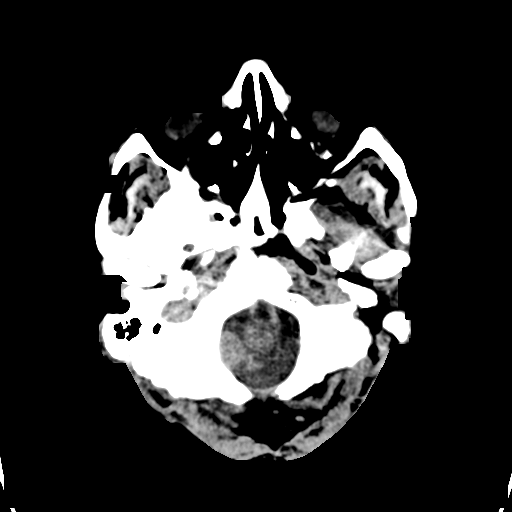
[im 3/32  bone]
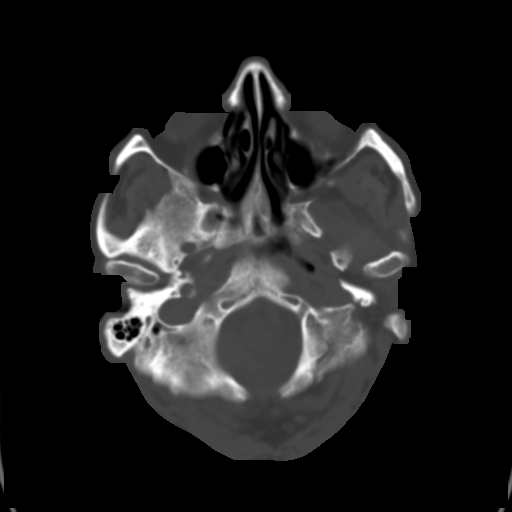
[im 5/32  brain]
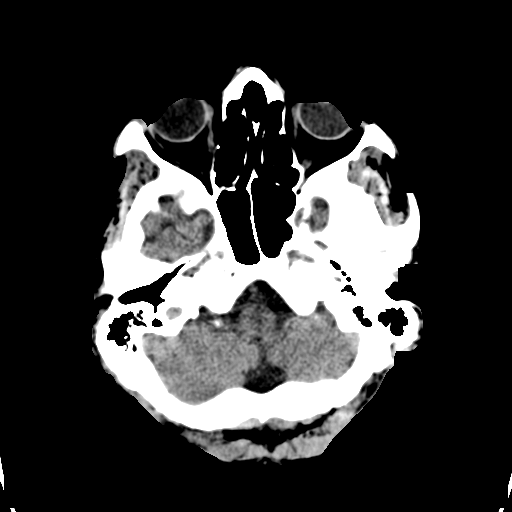
[im 7/32  brain]
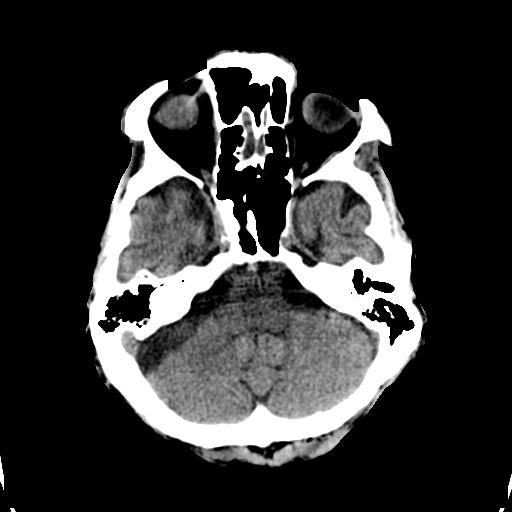
[im 9/32  brain]
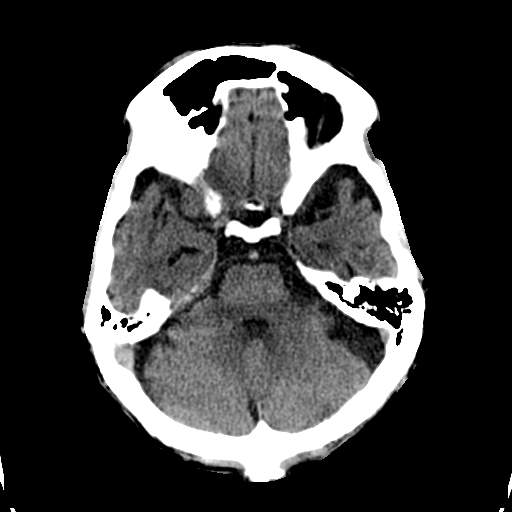
[im 12/32  brain]
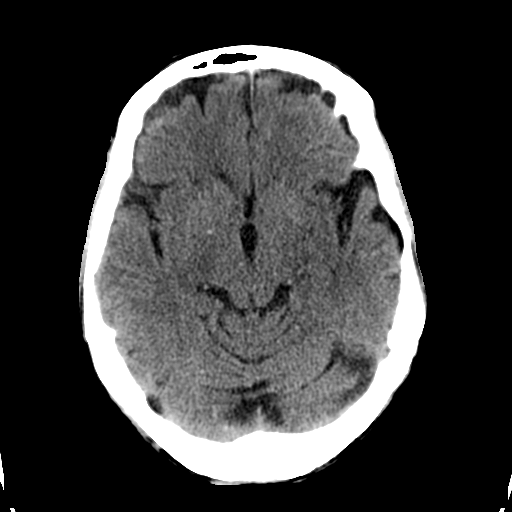
[im 12/32  bone]
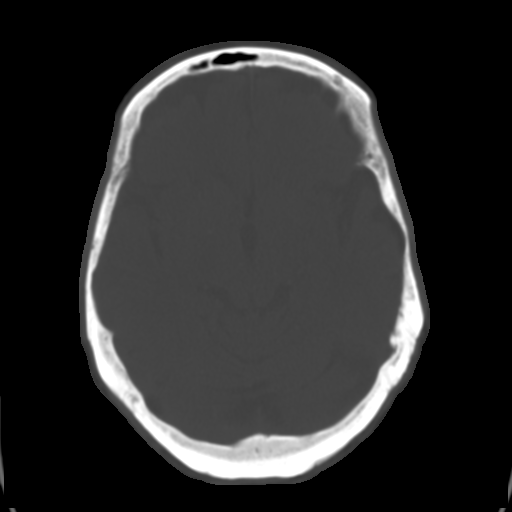
[im 14/32  brain]
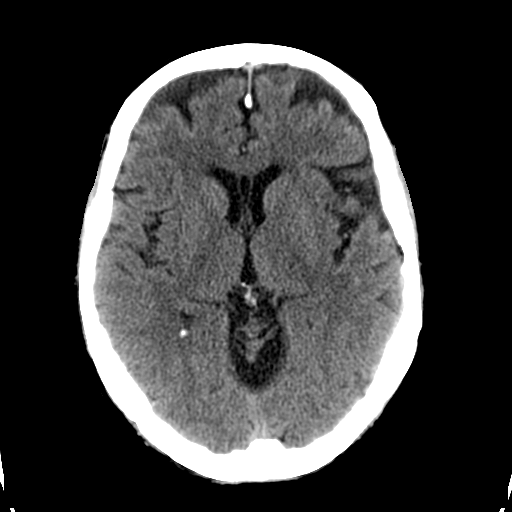
[im 16/32  brain]
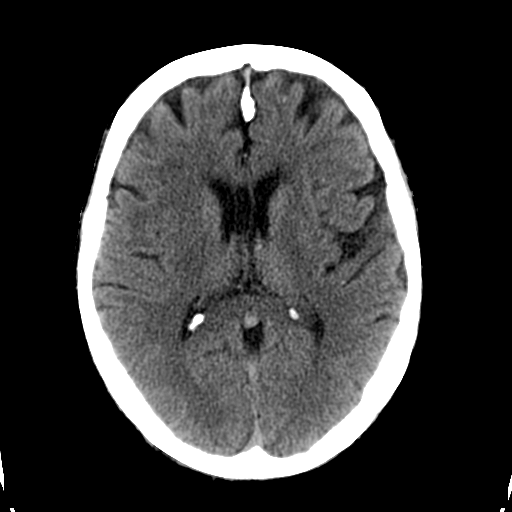
[im 18/32  brain]
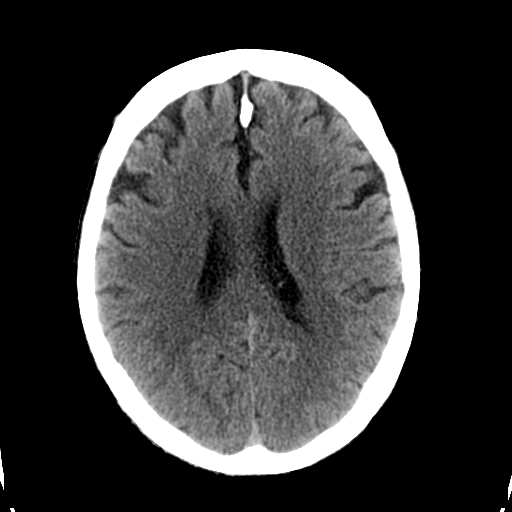
[im 20/32  brain]
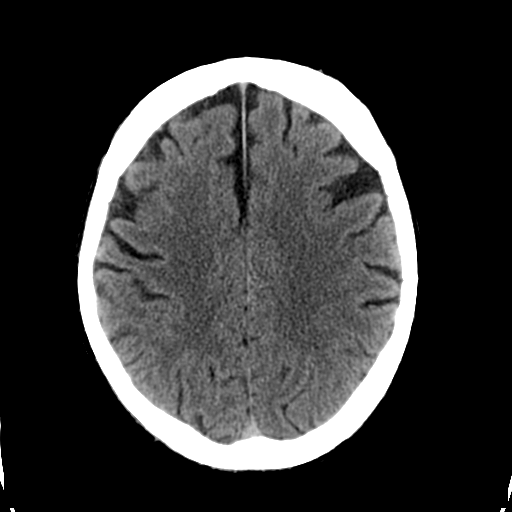
[im 20/32  bone]
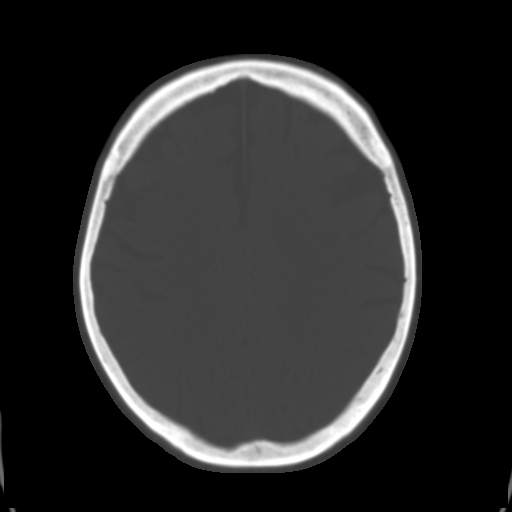
[im 23/32  brain]
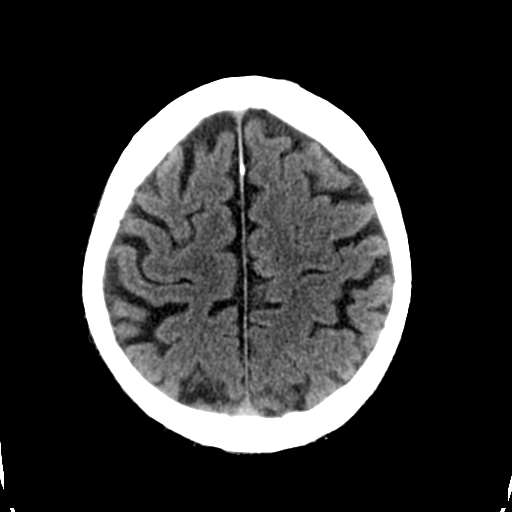
[im 25/32  brain]
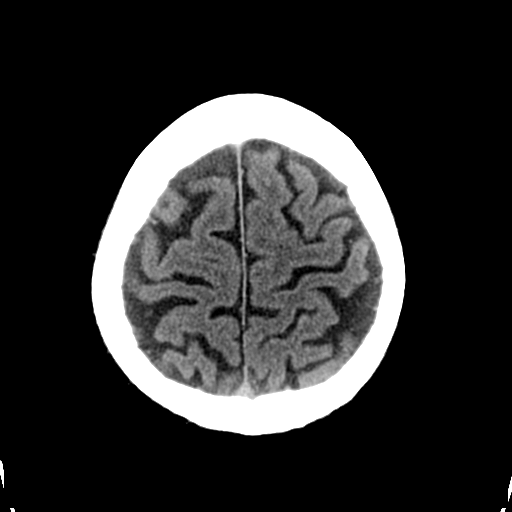
[im 27/32  brain]
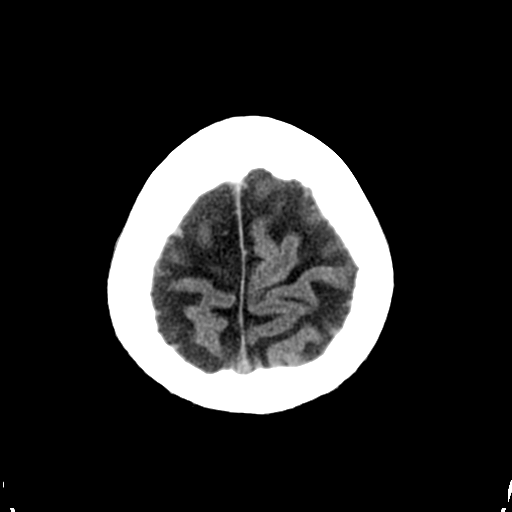
[im 29/32  brain]
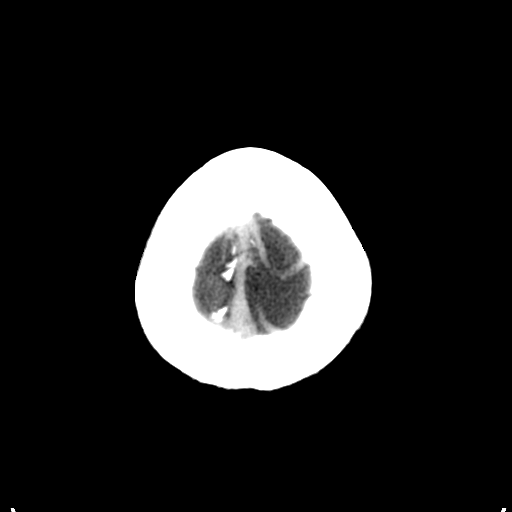
[im 29/32  bone]
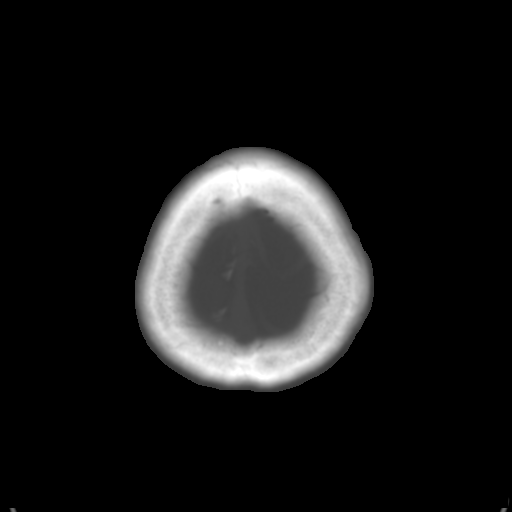

[13 of 30 positions shown; findings below may reference images not displayed]

FINDINGS: Age appropriate atrophy.  Ventricle size is normal.  Mild
hypodensity in the periventricular white matter is unchanged from
3665 and is most compatible with  chronic microvascular ischemia.
Negative for acute infarct.  Negative for hemorrhage or mass
lesion.

Following contrast infusion, no enhancing lesions are identified.
Negative for metastatic disease.  Calvarium is intact.
IMPRESSION: Negative for intracranial metastatic disease.  No acute
abnormality.

## 2011-03-17 ENCOUNTER — Encounter (HOSPITAL_BASED_OUTPATIENT_CLINIC_OR_DEPARTMENT_OTHER): Payer: MEDICARE | Attending: Internal Medicine

## 2011-03-18 NOTE — Letter (Signed)
August 21, 2009   Kinnie Scales. Annalee Genta, MD  [address]  [place], [place] [address]   Re:  [name], [name]              DOB:  05/07/1934   Dear Dr. Annalee Genta:   I saw the patient back today after a CT scan, we do not have the final  report, that shows no evidence of any lesion in his right lung mass as I  think all these lesions in the lung were inflammatory.  He is scheduled  for a PET scan in December, and we will see him back again at that time.  His blood pressure is 115/63, pulse 58, respirations 18, sats were 95%.  Lungs are clear to auscultation and percussion.   Ines Bloomer, M.D.  Electronically Signed   DPB/MEDQ  D:  08/21/2009  T:  08/21/2009  Job:  [id number]   cc:   Kari Baars, M.D.  Jethro Bolus, MD

## 2011-03-18 NOTE — Discharge Summary (Signed)
NAMELAMONTAE, RICARDO              ACCOUNT NO.:  [id number]   MEDICAL RECORD NO.:  [id number]          PATIENT TYPE:  INP   LOCATION:  1431                         FACILITY:  [facility]   PHYSICIAN:  Kari Baars, M.D.  DATE OF BIRTH:  November 20, 1933   DATE OF ADMISSION:  04/30/2009  DATE OF DISCHARGE:  05/03/2009                               DISCHARGE SUMMARY   DISCHARGE DIAGNOSES:  1. Acute aspiration pneumonia, improved.  2. Fever, resolved.  3. Dysphagia with high risk of aspiration, status post PEG tube.  4. 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  5. Right pulmonary nodule with no malignancy seen on fine-needle      aspiration (April 19, 2009).  6. Chronic obstructive pulmonary disease.  7. History of alcoholism.  8. Gastroesophageal reflux disease.  9. Neuropathy, likely secondary to prior alcohol use.  10.Hypertriglyceridemia.  11.Status post appendectomy.   DISCHARGE MEDICATIONS:  1. Avelox 400 mg daily x5 days.  2. Nexium 40 mg daily.  3. Lyrica 100 mg t.i.d.  4. Advair 250/50 b.i.d.  5. ProAir q.6 hours p.r.n.  6. Vitamin B1 100 mg daily.  7. Fentanyl 12.5 mcg two patches every 3 days.  8. Oxycodone 5/500 q.6 h p.r.n. breakthrough pain.  9. Senna p.r.n. constipation.   HISTORY OF PRESENT ILLNESS:  For full details, please see dictated  history and physical.  Briefly, Mr. Hark is a 77 year old white male  with a history of alcoholism, nicotine addiction, and recently diagnosed  epiglottis squamous cell cancer who presented to the emergency  department on June 28th with a complaint of fever following PEG tube  placement.  The patient was recently diagnosed with epiglottis squamous  cell cancer by Dr. Annalee Genta on April 04, 2009, after evaluation for  hearing loss and sore throat.  He was also found to have a right  pulmonary nodule evaluated by Dr. Edwyna Shell and underwent CT-guided biopsy  on June 17th.  Fortunately, this showed no malignant cells but  was  complicated by pneumothorax with resolution on followup chest x-ray.  He  was evaluated by Dr. Gaylyn Rong, Oncology, and Dr. Shon Baton in Radiation Oncology  with recommendations to proceed with radiation therapy to begin on May 24, 2009.  Given his weight loss and anticipated swallowing issues with  surgery, a PEG tube was placed on June 28th, the day of admission.  After returning home, before using the PEG tube, the patient developed  rigors and fever of 103.  He was brought to the emergency department  where he was found to have bilateral infiltrates on chest x-ray  consistent with bilateral lower lobe pneumonia.  He was given Avelox and  IV fluids.  Of note, his white count on admission was 14.9 with a BNP of  55.  He was admitted for further management.   HOSPITAL COURSE:  The patient was admitted to a medical bed.  He was  treated with IV Avelox.  Given the bilateral pneumonia and epiglottis  cancer, a swallowing evaluation was performed that did show high risk of  aspiration.  Speech therapy recommended thin liquids with  aspiration  precautions with all other nutrition to be administered through the PEG  tube.  Nutrition saw the patient and recommended tube feeds.  The  patient's daughter was instructed on tube feed administration.  At this  time, they recommend Osmolite 1.2, 330 mL, 5 times per day at 7:00 a.m.,  10:00 a.m., 1:00 p.m., 4:00 p.m., and 7:00 p.m. with water 70 mL for his  bolus feeding.  He has tolerated tube feeds well at this point with no  complications.  The patient's fever rapidly resolved with Avelox  therapy.  Repeat chest x-ray showed resolution of his bilateral lower  lobe infiltrates consistent with aspiration pneumonitis.  The patient  has remained stable throughout his hospitalization and is felt stable  for discharge home at this point.  It is felt that he can proceed with  radiation therapy after completion of the antibiotics, presuming that he  remains  afebrile.  Of note, the patient's abdomen remained benign with  no tenderness or drainage.  He also had a recent circumcision that  showed no complications and no drainage.   DISCHARGE INSTRUCTIONS:  Instructed to call if he has increasing fever,  shortness of breath, cough, or abdominal pain.   DISCHARGE DIET:  Osmolite 330 mL 5 times per day with H2O flushes of 70  mL before and after bolus.   DISPOSITION:  To home.   HOSPITAL FOLLOWUP:  He should follow up with Dr. Shon Baton and Dr. Gaylyn Rong as  scheduled.  Will follow up with Dr. Clelia Croft as needed.      Kari Baars, M.D.  Electronically Signed     WS/MEDQ  D:  05/03/2009  T:  05/03/2009  Job:  [id number]   cc:   Onalee Hua L. Annalee Genta, M.D.  Fax: [phone]   Margy Clarks, MD  Fax: [phone]   Jethro Bolus, MD

## 2011-03-18 NOTE — Letter (Signed)
August 01, 2009   Margy Clarks, MD  [address] [place], [place] [address]   Re:  XION, DEBRUYNE              DOB:  [birth year]   Dear [name],   I saw the patient back for followup.  As you know, he presented with 2  lesions, a right upper lobe lesion and then a right supraglottic lesion.  He has undergone radiation for the supraglottic lesion and is swallowing  much better.  Chest x-ray today showed that the right upper lobe lesion  which was recently biopsied now cannot be seen on chest x-ray.  I plan  to repeat his CT scan in 3 weeks and see him back.  At that time, if he  needs to get any scans regarding his head and neck cancer, I would be  happy to get it at the same time.  From my standpoint, I think he is  doing remarkably well.  I appreciate the opportunity of seeing the  patient.   Sincerely,   Ines Bloomer, M.D.  Electronically Signed   DPB/MEDQ  D:  08/01/2009  T:  08/02/2009  Job:  [id number]   cc:   Onalee Hua L. Annalee Genta, M.D.

## 2011-03-18 NOTE — Letter (Signed)
May 28, 2010   Jethro Bolus, MD  [address] [place], [place] [address]   Re:  [name], [name]              DOB:  1934-01-26   Dear Dr. Gaylyn Rong:   I saw the patient back in the office today after his needle biopsy and  unfortunately as we suspected, it was positive for non-small-cell lung  cancer.  He has squamous differentiation, but there is some glandular  formation, so I think this is probably a lung primary rather than  metastatic disease.  Whatever the cases, I will refer him back to you  for possible treatment.  I talked to he and his daughter about this.  I  will be happy to see him again if I can be of any further help such as a  Port-A-Cath.   Ines Bloomer, M.D.  Electronically Signed   DPB/MEDQ  D:  05/28/2010  T:  05/29/2010  Job:  [id number]

## 2011-03-18 NOTE — Letter (Signed)
May 15, 2010   Jethro Bolus, MD  [address] [place], [place] [address]   Re:  [name], [name]              DOB:  May 04, 1934   Dear Dr. Gaylyn Rong:   I saw the patient back today and unfortunately the CT scan shows further  enlargement of multiple metastatic lesions.  I plan to get this needle  biopsy as soon as we can and hopefully we will be able to get a  diagnosis at this time because it looks like he need to be started on  chemotherapy.  His blood pressure was 100/62, pulse 52, respirations 18,  and sats were 95%.  He is supposed to have his PEG removed and I think  we probably had a hold off on that until we get a diagnosis that he  might require supplementary tube feedings if he starts chemotherapy.   Ines Bloomer, M.D.  Electronically Signed   DPB/MEDQ  D:  05/15/2010  T:  05/16/2010  Job:  [id number]   cc:   Onalee Hua L. Annalee Genta, M.D.  Radene Gunning, MD, PhD

## 2011-03-18 NOTE — Letter (Signed)
April 11, 2009   Kinnie Scales. Annalee Genta, MD  [address]  [place] [address]   Re:  KAIMANA, LURZ              DOB:  [birth year]   Dear Onalee Hua,   This is in followup to our conversation on the patient.  His PET scan  showed that his right supraglottic lesion was positive, but also the  right upper lobe lateral lesion was also positive.  There was no  evidence of nodal metastasis.  I think he will be seeing you tomorrow in  the office.  Per our conversation, I think that we could treat this  right lung lesion either with wedge resection or with just radiation;  whatever the case is I think it needs to be biopsied to confirm the  diagnosis, so I will arrange for him to get a transthoracic needle  biopsy done in the next day or so.  I appreciate the opportunity of  seeing the patient.   Sincerely,   Ines Bloomer, M.D.  Electronically Signed   DPB/MEDQ  D:  04/11/2009  T:  04/12/2009  Job:  [id number]

## 2011-03-18 NOTE — H&P (Signed)
[name], CRITZER NO.:  [id number]   MEDICAL RECORD NO.:  [id number]          PATIENT TYPE:  INP   LOCATION:  1431                         FACILITY:  [facility]   PHYSICIAN:  Gwen Pounds, MD       DATE OF BIRTH:  08/28/1934   DATE OF ADMISSION:  04/30/2009  DATE OF DISCHARGE:                              HISTORY & PHYSICAL   PRIMARY CARE PHYSICIAN:  Dr. Eric Form.   CHIEF COMPLAINT:  Temperature of 103, rigors, cough and sputum of sudden  onset.   HISTORY OF PRESENT ILLNESS:  This is a 77 year old male with recent  diagnosis of squamous cell carcinoma of epiglottis per Dr. Annalee Genta on  April 04, 2009.  He subsequently has seen Dr. Gaylyn Rong in oncology who  recommended XRT only and is scheduled to start on May 24, 2009, with  the next visit scheduled for May 09, 2009.  He has also seen Dr. Edwyna Shell  for a chest lesion.  He had a PET scan which lit up.  CT biopsy of the  chest lesion did not show any cancer cells but was complicated by  pneumothorax which was followed to resolution on the April 27, 2009,  chest x-ray.  At that time, he had left lower lobe and lingular patchy  densities.  Superimposed bronchopneumonia was not excluded at the time.  Right base atelectasis versus fibrosis was noted.  Also, the patient has  a long tobacco history and a long alcohol history with underlying COPD.  Because of failure to thrive, weakness, decreased p.o. intake, only  being able to tolerate water and fluids, and a 30-pound weight loss over  two to three months, Dr. Fredia Sorrow put in a PEG feeding tube on April 30, 2009.  [facility] was to come out on May 01, 2009, and teach  the daughter and the patient how to use the PEG.  He was told to wait 24  hours for use.  Because of difficulty urinating, phimosis and balanitis,  Dr. Darvin Neighbours did a circumcision on Friday without any issues.  Of note,  the patient did quit tobacco one to two weeks ago.   Yesterday, on April 30, 2009,  after his procedure, he developed rigors.  Fever was reported up to 103 at 6:00 p.m.  Daughter gave him Tylenol and  brought him to the emergency department for evaluation and treatment.  In the emergency department, workup was compatible with bilateral lower  lobe pneumonia.  Avelox was given.  Decreased blood pressure was noted.  Fluids were given.  I was called for inpatient admission.  Of note,  cough and sputum of sudden onset developed today.  The patient complains  of significant dry mouth.   PAST MEDICAL HISTORY:  1. Neuropathy.  2. GERD.  3. Squamous cell carcinoma of the epiglottis.  4. Former heavy smoker.  5. COPD.  6. Former heavy drinker, quit in 2001.  7. History of Mallory Weiss tear.  8. Circumcision at the age of 64.  9. Chronic pain.  10.History of thrush.  11.Hypertriglyceridemia.  12.Appendectomy.  13.Recent  pneumothorax secondary to CT-guided biopsy of lung lesion.   ALLERGIES:  No known drug allergies.   MEDICATIONS:  1. Nexium 40 daily.  2. Lyrica 100 t.i.d.  3. Advair 250/50 b.i.d.  4. Pro Air as directed.  5. Vitamin B1 100 daily.  6. Fentanyl 12.5 mcg 2 patches q.3 days.  7. Oxycodone 500 mg/5 mL q.6 h. p.r.n. breakthrough pain.  8. Recent Fluconazole use.  9. Senna for constipation.   SOCIAL HISTORY:  The patient cannot read or write.  He is poorly  educated.  He is divorced.  He lives by himself.  Daughter and sister  are going to care for him while he undergoes his upcoming radiation  treatment.  He is a former heavy alcoholic and a former cigarette user.   FAMILY HISTORY:  Lung cancer, colon cancer, vasculopathy.   REVIEW OF SYSTEMS:  Please see HPI for further details.  The patient  denies any shortness of breath, chest pain or chest discomfort.  His  belly is a little bit tender where PEG tube was inserted.  He is not  having any bowel or bladder issues currently.  His penis is slowly  recovering.  He has some recent failure to thrive.   All other organ  systems reviewed and negative.   PHYSICAL EXAMINATION:  VITAL SIGNS:  Temperature 103, down to 100.5,  down to 99.3, down to 97.8 with the use of Tylenol.  Heart rate 102,  respiratory rate 24, saturation 89% on room air and 98% on 2 liters.  GENERAL:  Alert and oriented.  HEENT:  Oropharynx is dry.  Dentures noted.  NECK:  No lymphadenopathy.  PULMONARY:  Mild basilar rhonchi.  CARDIAC:  Regular.  ABDOMEN:  Soft.  PEG tube in place.  Clean, dry, intact.  Lajoyce Corners is on.  EXTREMITIES:  No edema.  Groin shows penis without pus.  There is no  lesion.  Looks fine.  Areas of shaved belly and shaved pubic region show  no lesions.   ANCILLARY DATA:  Lactic acid 1.1.  White count 14.9, hemoglobin 13.5,  platelet count 345,000, 85% segs.  Sodium 136, potassium 4.4, chloride  103, bicarb 22, BUN 11, creatinine 1.1, glucose 114.  BNP 55.  Urinalysis negative.  ABG shows pH 7.451, pCO2 33, pO2 69, satting 94%.  Chest x-ray shows bilateral lower lobe pneumonia.   ASSESSMENT AND PLAN:  This is a 77 year old man undergoing lots of  treatment for concurrent issues, being admitted with pneumonia.  This is  either community-acquired pneumonia versus aspiration pneumonia with  aspiration pneumonitis.  This is also associated with hypotension.   PLAN:  1. We will admit.  2. He does not appear septic at this current time.  3. Aggressive pulmonary toilet has been ordered.  4. Oxygen has been ordered.  He will maintain on it.  5. Will continue IV antibiotics.  6. DVT prophylaxis has been ordered.  7. Will start using PEG.  8. Will resume home medications.  9. Speech evaluation will evaluate for aspiration.  10.PT, OT and speech therapy consults have all been ordered.  11.Case management consult has been ordered.  12.Aggressive IV fluids for hypotension has been ordered.  13.Tylenol for fever has been ordered.  14.End-of-life discussion was started.  The patient is currently full       code and will probably remain that way.  15.For GERD, Nexium has been ordered.  16.For neuropathy, Lyrica will be continued.  17.For his chronic pain, his pain medications  will be continued.  18.For constipation, Senokot-S will be continued.  19.Followup CBC and CMET in the a.m.  Chest x-ray, PA and lateral,      tomorrow.  Make sure the blood cultures were ordered.  20.Dr. Clelia Croft will see the patient morning, will assume care and will      continue with current management.      Gwen Pounds, MD  Electronically Signed     JMR/MEDQ  D:  05/01/2009  T:  05/01/2009  Job:  [id number]   cc:   Kari Baars, M.D.  Fax: [phone]   Ines Bloomer, M.D.  [address]  [place]  [place] [address]   Jethro Bolus, MD   Kinnie Scales. Annalee Genta, M.D.  Fax: [phone]   Jodi Marble. Fredia Sorrow, M.D.  Fax: [phone]

## 2011-03-18 NOTE — Op Note (Signed)
NAMEBRAYLIN, Clayton              ACCOUNT NO.:  [id number]   MEDICAL RECORD NO.:  [id number]          PATIENT TYPE:  AMB   LOCATION:  SDS                          FACILITY:  [facility]   PHYSICIAN:  Kinnie Scales. Annalee Genta, M.D.DATE OF BIRTH:  12-09-1933   DATE OF PROCEDURE:  04/04/2009  DATE OF DISCHARGE:  04/04/2009                               OPERATIVE REPORT   PREOPERATIVE DIAGNOSIS:  Left ulcerative epiglottic lesion.   POSTOPERATIVE DIAGNOSES:  1. Left ulcerative epiglottic lesion.  2. Left true vocal cord lesion.   SURGICAL PROCEDURES:  Direct laryngoscopy with cervical esophagoscopy  and biopsy of the above lesions.   SURGEON:  Kinnie Scales. Annalee Genta, MD   ANESTHESIA:  General endotracheal.   COMPLICATIONS:  None.   ESTIMATED BLOOD LOSS:  Less than 50 mL.   DISPOSITION:  The patient transferred to the operating room to the  recovery room in stable condition.   BRIEF HISTORY:  The patient is a 77 year old white male who was referred  to our office for evaluation of symptoms of hearing loss and evaluation  in the office including flexible nasal laryngoscopy revealed an  ulcerative lesion involving the left laryngeal surface of the epiglottis  with normal vocal cord mobility.  The patient had no airway issues and  complained primarily of sore throat and pain on swallowing.  Given his  history of chronic cigarette smoking and the above findings, I  recommended that we undertake laryngoscopy with biopsy of the lesion for  pathologic diagnosis.  The risks, benefits, and possible complications  including the possibility of tracheostomy on an emergency basis were  discussed in detail with the patient and his daughter and they  understood and concurred our plan for surgery which was scheduled as an  outpatient under general anesthesia on April 04, 2009.   PROCEDURE IN DETAIL:  The patient was brought to the operating room at  [facility] Main OR and placed in supine position on  the  operating table.  General endotracheal anesthesia was established  without difficulty and when the patient was adequately anesthetized, his  oral cavity, oropharynx, and larynx were examined using a Dedo  laryngoscope.  The patient was found to have an ulcerative mass  involving the laryngeal surface of the epiglottis extending from the  right paramedian position and involving the entire left aspect of the  epiglottis.  The piriform sinus was free of disease bilaterally and the  hypopharynx appeared normal.  Cervical esophagoscopy was performed and  there was no evidence of esophageal ulcer, mass, or lesion.  The patient  was then biopsied.  Multiple biopsies were taken from the left  epiglottis.  He was also noted to have an exophytic leukoplakic mass  along the left true vocal cord and this was also biopsied with micro  forceps.  The tissue was sent to pathology separately for gross  and microscopic evaluation.  The patient's airway was then thoroughly  suctioned and cleared of any clotted material and debris.  The patient  was then gently awakened from his anesthetic, extubated, and transferred  from the operating room to  the recovery room in stable condition.  There  were no complications and blood loss was less than 50 mL.           ______________________________  Kinnie Scales. Annalee Genta, M.D.     DLS/MEDQ  D:  95/63/8756  T:  04/04/2009  Job:  [id number]

## 2011-03-18 NOTE — Letter (Signed)
October 10, 2009   Margy Clarks, MD  [address] [place] [address]   Re:  [name], [name]              DOB:  11-11-1933   Dear Gabriel Rung,   I saw the patient in the office today and reviewed his PET scan.  Apparently, he is going to have a biopsy done by Dr. Annalee Genta of his  oropharynx to be sure he has no evidence of recurrence of his cancer.  He has a new right upper lobe nodule that is 7 mm in size that has a  standard uptake value of 2.1 which of course they are concerned this may  be a metastatic disease.  That is a possibility but given that the other  one that we had there to which the biopsy was negative, I think the best  approach will be just to take conservative approach in this nodule.  So  I have talked to the family and they agree, and I plan to see him back  again in 3 months and get a CT scan to reevaluate this nodule.   Ines Bloomer, M.D.  Electronically Signed   DPB/MEDQ  D:  10/10/2009  T:  10/11/2009  Job:  [id number]   cc:   Onalee Hua L. Annalee Genta, M.D.

## 2011-03-18 NOTE — Op Note (Signed)
[name], [name]              ACCOUNT NO.:  [id number]   MEDICAL RECORD NO.:  [id number]          PATIENT TYPE:  AMB   LOCATION:  DAY                          FACILITY:  [facility]   PHYSICIAN:  Ronald L. Earlene Plater, M.D.  DATE OF BIRTH:  08-21-1934   DATE OF PROCEDURE:  04/27/2009  DATE OF DISCHARGE:                               OPERATIVE REPORT   DIAGNOSIS:  Phimosis, balanitis.   OPERATIVE PROCEDURE:  Circumcision.   SURGEON:  Gaynelle Arabian, M.D.   ANESTHESIA:  Spinal.   BLOOD LOSS:  Negligible.   TUBES:  None.   COMPLICATIONS:  None.   INDICATION FOR PROCEDURE:  [name] is a very nice 77 year old white  male who presents with an epiglottic cancer and a small lung lesion.  He  was found have significant balanitis and phimosis and is to undergo  chemotherapy.  The foreskin cannot be retracted, and after understanding  risks, benefits and alternatives, he elected to undergo the above  procedure.   PROCEDURE IN DETAIL:  The patient was placed in supine position, had  proper spinal anesthesia, was prepped and draped with Betadine in a  sterile fashion.  A dorsal slit was performed, and the glans was covered  and cleaned with Betadine.  A circumferential incision was made of the  shaft skin at appropriate level and extended to Buck's fascia and the  corpus spongiosum, and a circumferential incision was made 2 mm proximal  to the corona aerota and extended at the same levels.  Foreskin was then  removed utilizing both blunt and sharp dissection, and bleeding vessels  were coagulated with Bovie coagulation cautery.  Irrigation was  performed.  Good hemostasis was noted to be present.  The shaft skin was  then approximated to the mucosa with a dorsal stitch with 3-0 chromic  catgut and a ventral U stitch and was closed with running simple  stitches.  A dorsal penile block was performed with 10 mL of 0.25  Marcaine.  The wound was dressed with Vaseline gauze, 4x4 and Coban.  A  straight catheter with 14 French rubber catheter was performed, and the  bladder was emptied, and the patient was taken to the recovery room  stable.      Ronald L. Earlene Plater, M.D.  Electronically Signed     RLD/MEDQ  D:  04/27/2009  T:  04/27/2009  Job:  [id number]

## 2011-03-18 NOTE — Letter (Signed)
Mar 28, 2009   Kinnie Scales. Annalee Genta, MD  [address]  [place], [place] [address]   Re:  [name], [name]              DOB:  May 21, 1934   Dear Onalee Hua:   I saw the patient in the office today and I reviewed a CT scan and his  past PET scan.  The past PET scan was read as negative in  October, but  I am worried that there were some increased activity near epiglottis.  I  went ahead and ordered another PET scan for comparison in view of you  seeing a mass at his epiglottis.  As far as his lung lesions that are  actually the lesions that we saw previously, have resolved and there is  some other small nodules in this CT scan, which would go along with  this, probably be an inflammatory.  I will await the second PET scan,  and he will be seeing you for a followup.  I will call him back after I  see, I will give him a call after his PET scan, but I think he probably  does not have any malignant disease in his chest, and this is probably a  supraglottic tumor as you suggested.   His blood pressure was 134/71, pulse  53, respirations 18, saturations  were 94%.  Lungs clear to auscultation and percussion.   I appreciate the opportunity of seeing the patient.   Sincerely,   Ines Bloomer, M.D.  Electronically Signed   DPB/MEDQ  D:  03/28/2009  T:  03/29/2009  Job:  [id number]

## 2011-03-18 NOTE — Letter (Signed)
January 16, 2010   Kinnie Scales. Annalee Genta, M.D.  [address]  [place] [address]   Re:  [name], [name]              DOB:  07/08/1934   Dear Dr. Gaylyn Rong:   I saw the patient today.  His lungs re-expanded which is good.  He is  doing well overall. I have talked to him about stopping smoking.  I have  recommended he get another CT scan in 3 months since all the biopsies  are nondiagnostic.  I think it is time to wait and see which of these  nodules get larger before we consider repeating any biopsy.  Discussed  this in detail with his daughter and she agrees as well as with Ms.  Menken.  I will see him back again in 3 months with a chest x-ray.   Ines Bloomer, M.D.  Electronically Signed   DPB/MEDQ  D:  01/16/2010  T:  01/17/2010  Job:  [id number]
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
## 2011-03-18 NOTE — Letter (Signed)
November 30, 2008   W. Buren Kos, MD  [address]  [place], [place] [address]   Re:  [name], [name]              DOB:  08/22/1934   Dear Gala Romney:   I appreciate the opportunity of seeing the patient.  This 77 year old  patient has a long history of smoking.  States he has been smoking for  67 years and still continues to smoke.  He had a CT scan on 08/01/2008  that showed some scarring in the right base.  A followup CT scan was  recommended and it showed a new 10-mm spiculated lesion in the right  lower lobe that was not seen in the previous CT scan.  A PET scan was  done, showed minimal uptake in this area, but there was still concern  that this may be a cancer.  He does give a history of having a lung  biopsy done on his lung 15 years ago and does have an evidence of an  anterior thoracotomy incision.  He cannot remember what was done.  He  has had no hemoptysis, fever, chills, or excessive sputum.   PAST MEDICAL HISTORY:  He has emphysema and hypercholesterolemia.   MEDICATIONS:  Nexium 40 mg daily, Lyrica 100 mg 3 times a day, TriCor  145 mg a day, Advair Diskus twice a day, ProAir 2 puffs once a day,  Spiriva once a day, and fish oil.   ALLERGIES:  No allergies.   FAMILY HISTORY:  Noncontributory.   Pulmonary function test showed an FVC of 2.60, 68% of predicted.  FEV-1  of 1.29 and 42% of predicted.   SOCIAL HISTORY:  He is divorced.  Social history otherwise is retired.  Smokes one and half-to-2 packs a day and does not drink alcohol.   REVIEW OF SYSTEMS:  Vital Signs:  He is 200 pounds.  He is 5 feet 9  inches.  His weight has been stable.  Cardiac:  He gets shortness of  breath with exertion.  No recent angina or atrial fibrillation.  Pulmonary:  Bronchitis, productive cough, wheezing.  GI:  He has a  hiatal hernia, reflux, and dysphagia.  GU:  No dysuria, frequent  urination, or kidney disease.  Vascular:  He has pain in his feet when  lying down.  No DVT or  TIAs.  Neurological:  No dizziness, headaches,  blackouts, or seizures.  Musculoskeletal:  Arthritis.  Psychiatric:  No  depression or nervous.  Eye/ENT:  Decreased hearing.  Hematological:  No  problems with bleeding, clotting disorders, or anemia.   PHYSICAL EXAMINATION:  GENERAL:  He is a well-developed Caucasian male  with a pack of cigarettes in his left pocket.  VITAL SIGNS:  His blood pressure is 115/67, pulse 82, respirations 22,  and sats are 92%.  HEAD, EYES, EARS, NOSE, AND THROAT:  Unremarkable except for deep  hearing bilaterally.  NECK:  Supple.  There is no supraclavicular or axillary adenopathy.  CHEST:  Increased AP diameter with decreased breath sounds, mild  inspiratory wheezes.  HEART:  Regular sinus rhythm.  No murmurs.  ABDOMEN:  Soft.  No hepatosplenomegaly.  EXTREMITIES:  Pulses are 2+.  No clubbing or edema.  NEUROLOGICAL:  He is oriented x3.  Sensory and motor intact.   I discussed the situation with the patient since this is a new lesion  and even though it was negative on PET.  I think it probably will be  improved on getting a needle biopsy.  I think the changes of the  pneumothorax will be decreased because of his previous surgery.  I will  plan to have him do that at [facility].   I appreciate the opportunity of seeing the patient.  I will let you know  when we will see his findings.   Ines Bloomer, M.D.  Electronically Signed   DPB/MEDQ  D:  11/30/2008  T:  11/30/2008  Job:  [id number]

## 2011-03-18 NOTE — Assessment & Plan Note (Signed)
OFFICE VISIT   [name], [name]  DOB:  October 07, 1934                                        May 02, 2009  CHART #:  [id number]   The patient came today and his needle biopsy showed distant inflammatory  tissue.  His blood pressure was 127/68, pulse 83, respirations 18, and  saturations were 90%.  I discussed this with Dr. Clelia Croft and Dr. Kathrynn Running,  and I think the best plan is to proceed with radiation and chemotherapy  for his supraglottic cancer.  The patient also showed that he was having  trouble with his phimosis and with some balanitis and for this reason I  referred him to Dr. Darvin Neighbours who will probably have to do a  circumcision before he starts his radiation chemotherapy.  I will see  him back again in 3 months.   [name], M.D.  Electronically Signed   DPB/MEDQ  D:  05/02/2009  T:  05/03/2009  Job:  [id number]   cc:   Windy Fast L. Earlene Plater, M.D.  Artist Pais Kathrynn Running, M.D.  Kari Baars, M.D.

## 2011-03-18 NOTE — Letter (Signed)
December 12, 2008   W. Buren Kos, MD  [address]  [place], [place] [address]   Re:  [name], [name]              DOB:  Aug 06, 1934   Dear Gala Romney,   I saw the patient back today.  We attempted to do a needle biopsy of  this right lower lobe lesion and fortunate for him that it has decreased  in size.  It was about 3/4th of what we had initially thought on a  limited CT scan, when he was scheduled for the needle biopsy, but he is  still concerned that it may be a cancer, but __________ to follow it  with serial CT scans and did not do a needle biopsy.  I will see him  back again in 3 months with another CT scan and he agrees with this  plan.  His blood pressure was 147/81, pulse 67, respirations 18, and  sats were 96%.   Sincerely,   Ines Bloomer, M.D.  Electronically Signed   DPB/MEDQ  D:  12/12/2008  T:  12/12/2008  Job:  [id number]
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
## 2011-03-21 NOTE — Consult Note (Signed)
. [facility]  Patient:    [name], [name]                     MRN: [id number] Proc. Date: 10/07/00 Adm. Date:  [id number] Attending:  Cathren Laine Dictator:   Jacobo Forest, M.D. CC:         Anselmo Rod, M.D.  Valentino Hue. Magrinat, M.D.  Kristian Covey, M.D.   Consultation Report  REASON FOR CONSULTATION:  Hematemesis.  ASSESSMENT: 1. Upper gastrointestinal bleed. 2. Anemia, microcytic. 3. Alcohol syndrome with elevated LFTs, likely alcoholic hepatitis. 4. Thrombocytopenia. 5. Alcoholism. 6. Tobacco abuse. 7. Chronic obstructive pulmonary disease. 8. Hypoalbuminemia. 9. Hyperglycemia.  RECOMMENDATIONS: 1. Emergent upper endoscopy. 2. N.p.o. 3. Monitor serial CBCs, keep hematocrit greater than 25. 4. Continue IV protonix. 5. Consider hepatitis panel.  DISCUSSION:  [name] is a 77 year old white male with a past medical history as above who presents to [facility] Emergency Room with a 24 hour history of hematemesis.  The patient reports approximately 18 episodes of black emesis, no frank blood noted.  He also reports melanotic stools x 24 hours.  He denies any history of similar episodes.  He currently complains of weakness and has been feeling fatigued for the past 7 days.  Given the patients history of alcoholism, and smoking, hematemesis is likely secondary to his esophageal varices versus gastric ulcer/gastritis.  The patient is currently hemodynamically stable with a hemoglobin of 11.5.  Will perform EGD urgently to evaluate upper GI bleed.  PAST MEDICAL HISTORY AND PAST SURGICAL HISTORY:  See above.  SOCIAL HISTORY:  Lives in [place] alone.  Smokes 2 packs per day x 50 years.  Drinks alcohol 6 beers per day x 10 years.  FAMILY HISTORY:  Father died at age 36 of ? cancer, mother died of ? cancer.  MEDICATIONS:  Over-the-counter NSAIDs p.r.n.  ALLERGIES:  No known drug allergies.  REVIEW OF SYSTEMS:  No chest  pain, shortness of breath, headache, night sweats, syncope, hematuria.  PHYSICAL EXAMINATION:  VITAL SIGNS:  Temperature 97.9, pulse 117, blood pressure 115/74, O2 saturations 97% on room air.  GENERAL:  Disheveled white male in no acute distress, alert, oriented, mild tremor.  HEENT:  PERRL, upper dentures, throat clear.  NECK:  Supple.  No lymphadenopathy.  No thyromegaly.  CARDIOVASCULAR:  Regular rate without murmurs.  RESPIRATORY:  Clear to auscultation bilaterally.  ABDOMEN:  Bowel sounds present, soft, nontender, positive hepatomegaly.  EXTREMITIES:  No edema.  LABORATORY DATA:  Hemoglobin 11.5, WBC 16.0, MCV 103, platelets 117, PT 14.6, INR 1.3.  Basic metabolic panel normal except sodium 134, glucose 189, alcohol level pending.  Chest x-ray:  COPD with right middle lobe scar. DD:  10/07/00 TD:  10/07/00 Job: [id number]

## 2011-03-21 NOTE — H&P (Signed)
[facility]  Patient:    [name], [name] Visit Number: [id number] MRN: [id number]          Service Type: PSY Location: [id number] Attending Physician:  Rachael Fee Dictated by:   Candi Leash. Orsini, N.P. Admit Date:  02/15/2002                     Psychiatric Admission Assessment  IDENTIFYING INFORMATION:  A 77 year old divorced white male, voluntarily admitted for depression and alcohol abuse on February 15, 2002.  HISTORY OF PRESENT ILLNESS:  The patient presents with a history of alcohol abuse and depression, reports he relapsed about a year ago after a 17-year history of sobriety.  The patient feeling very lonely and depressed after his divorce.  He is living alone, with little social support.  He has been drinking every day, from 8-12 beers.  His sleep has been satisfactory, with decreased appetite with 15 to 20 pound weight loss.  He reports no electricity in his home and does not eat for several days at a time.  He reports he intends to live with his son in [place] after discharge.  He denies any suicidal or homicidal ideations or psychosis.  He denies any withdrawal symptoms at present.  PAST PSYCHIATRIC HISTORY:  First hospitalization at [facility], was in [facility] for rehabilitation and at [facility] about 8 to 9 months ago. Longest history of sobriety has been 17 years.  He has no suicide attempts.  SOCIAL  HISTORY:  A 86-year-old divorced white male, been divorced for 9 years. That was his first marriage.  He was married for 48 years.  He has 2 adult children.  He lives alone and is living on Social Security income.  He states he cannot read or write.  FAMILY HISTORY:  None.  ALCOHOL DRUG HISTORY:  The patient smokes.  He has been drinking 8-10 beers every day for the past year.  His last drink was yesterday, with drinking 3 beers.  No history of blackouts or seizures.  No history of substance abuse.  PAST MEDICAL HISTORY:   Primary care Damario Gillie:  The patient attends [facility].  Medical problems are none.  Medications are none.  DRUG ALLERGIES:  No known drug allergies.  PHYSICAL EXAMINATION:  The patient appears as an unkempt, well-nourished male without complaints.  His vital signs:  97.5, 59 heart rate, 20 respirations, blood pressure 121/81.  He is 5 feet 11 inches tall and weighs 130 pounds.  LABORATORY DATA:  CBC was within normal limits.  CMET was within normal limits. expect for an albumin which is low at 3.4.  MENTAL STATUS EXAMINATION:  He is an alert, elderly, cooperative, casually dressed Caucasian male.  Speech is normal and relevant, mood is depressed, affect is flat, thought processes are coherent.  There is no evidence of psychosis, no auditory or visual hallucinations, no  suicidal or homicidal ideations, no paranoia.  Cognitive function intact.  Memory is good, judgment and insight is fair.  He has a general fund of knowledge.  The patient is illiterate.  ADMISSION DIAGNOSES: Axis I:    1. Depression not otherwise specified.            2. Alcohol abuse rule out dependence. Axis II:   Deferred. Axis III:  None. Axis IV:   Problems with primary support group, other psychosocial problems. Axis V:    Current 45, estimated this past year 65-70.  INITIAL PLAN OF  CARE:  Voluntary admission for alcohol abuse and depression. Contract for safety, check every 15 minutes.  Will initiate the low-dose Librium protocol, encourage fluids.  We will initiate an antidepressant for depressive symptoms, have trazodone available for sleep and Motrin for pain. Our plan is to detox safely and for patient to remain alcohol free.  TENTATIVE LENGTH OF STAY:  3 to 5 days. Dictated by:   Candi Leash. Orsini, N.P. Attending Physician:  Rachael Fee DD:  02/16/02 TD:  02/17/02 Job: [id number]

## 2011-03-21 NOTE — Discharge Summary (Signed)
[facility]  Patient:    [name], [name] Visit Number: [id number] MRN: [id number]          Service Type: PSY Location: [id number] Attending Physician:  Jeanice Lim Dictated by:   Reymundo Poll Dub Mikes, M.D. Admit Date:  03/28/2002 Disc. Date: 02/21/02                             Discharge Summary  CHIEF COMPLAINT AND PRESENT ILLNESS:  This is the first hospitalization at [facility] for this 77 year old male voluntarily admitted for depression and alcohol dependence.  History of alcohol dependence and depression.  Relapsed about a year ago after 17-year history of sobriety. Very lonely, depressed, after his divorce.  Living alone.  Little social support.  Drinking every day from 8-12 beers.  Sleep has been satisfactory. Decreased appetite, 15-20 pound weight loss.  No electricity in his home. Does not eat for several days at a time.  Intends to live with his son in [place] after discharge.  Denied any suicidal or homicidal ideation.  No psychosis.  No withdrawal at the time of the evaluation.  PAST PSYCHIATRIC HISTORY:  First time at [facility].  Had been in [facility] nine months ago.  Sobriety for 17 years.  ALCOHOL/DRUG HISTORY:  Drinking 8-12 beers every day for the past year.  Last drink was the day before this admission.  No other substance use.  PHYSICAL EXAMINATION:  Performed and failed to show any acute findings.  MENTAL STATUS EXAMINATION:  Alert, cooperative male.  Speech was normal and relevant.  Mood is depressed.  Affect is flat.  Thought processes are coherent.  No evidence of psychosis.  No auditory or visual hallucinations. No suicidal or homicidal ideation.  No paranoia.  Cognition well-preserved.  ADMISSION DIAGNOSES: Axis I:    1. Depressive disorder not otherwise specified.            2. Alcohol dependence. Axis II:   No diagnosis. Axis III:  No diagnosis. Axis IV:    Moderate. Axis V:    Global Assessment of Functioning upon admission 30; highest Global            Assessment of Functioning in the last year 55-60.  LABORATORY DATA:  CBC was within normal limits.  Blood chemistries were within normal limits.  Thyroid profile was within normal limits.  HOSPITAL COURSE:  He was admitted.  He was detoxified using Librium.  He was placed on Zoloft, which was increased up to 50 mg per day.  He worked on establishing a Agricultural engineer and relapse prevention plan.  He benefited from this stay.  His mood gradually improved.  His affect became brighter.  On February 21, 2002, he was fully detoxified.  He was going to go into [facility] for 28-day rehab.  First bed available Mar 07, 2002.  Will be going home. Will come to the [facility] while waiting for the bed to be available to him.  He was going to stay at the sisters house, left in the company of his attorney. No suicidal ideation.  No homicidal ideation.  Willing to pursue outpatient treatment.  DISCHARGE DIAGNOSES: Axis I:    1. Alcohol dependence.            2. Depressive disorder not otherwise specified. Axis II:   No diagnosis. Axis III:  No diagnosis. Axis IV:  Moderate. Axis V:    Global Assessment of Functioning upon discharge 50.  DISCHARGE MEDICATIONS: 1. Zoloft 50 mg per day. 2. Trazodone 100 mg at night for sleep.  FOLLOW-UP:  [facility] while waiting for a bed at [facility]. Dictated by:   Reymundo Poll Dub Mikes, M.D. Attending Physician:  Jeanice Lim DD:  03/30/02 TD:  04/02/02 Job: [id number]

## 2011-03-21 NOTE — Consult Note (Signed)
[place]. [facility]  Patient:    [name], [name]                     MRN: [id number] Proc. Date: 10/07/00 Adm. Date:  [id number] Attending:  Cathren Laine CC:         Anselmo Rod, M.D.  Rosanne Sack, M.D.   Consultation Report  DATE OF BIRTH:  August 24, 1934  HISTORY OF PRESENT ILLNESS:  This is a 77 year old white male who is an unassigned patient admitted to Dr. Nolon Rod Monguilods service today and consulted with Anselmo Rod, M.D.  The patient is confused, somewhat combative. It is very difficult to get much of a history from him in that he seems to be going through some withdrawal.  I have spoken with Anselmo Rod, M.D. in reviewing the chart. He apparently has been sick for a couple of days. Has had hematemesis with kind of black emesis with some dark stools for at least a day or two. He was weak but he denies any prior similar episode.  Apparently, he gives a history of alcohol abuse and smoking. Was seen through the emergency room and noted to have a hemoglobin of 11.5 which dropped down to 8.6 with hydration.  The patient then underwent endoscopy by Anselmo Rod, M.D. who saw a large Mallory-Weiss esophageal tear but this was not actively bleeding.  The patient is now admitted for observation, rehydration, and possible repeat endoscopy.  ALLERGIES:  No known allergies.  CURRENT MEDICATIONS:  Apparently, the only thing he is taking is over-the-counter NSAIDs.  REVIEW OF SYSTEMS:  NEUROLOGICAL:  He has had no history of seizures though apparently he is kind of going through withdrawal. PULMONARY:  Smokes cigarettes with COPD. CARDIAC:  No history of heart disease or chest pain. GASTROINTESTINAL:  He has had apparently no prior bleed. UROLOGIC:  No kidney stones or kidney infections.  PHYSICAL EXAMINATION:  VITAL SIGNS:  His pulse is 100, blood pressure 120/80.  GENERAL:  He is a somewhat jaundiced,  combative, white male but he is alert. Does respond, actually will respond appropriately to his name and age.  NECK:  Supple. I feel no mass.  LUNGS:  Clear to auscultation.  HEART:  Tachycardic without murmur.  ABDOMEN:  Soft. He has what looks like an anterior right chest scar but he cannot really tell me why he had this.  RECTAL:  I did not do a rectal exam. He is passing maroon stools out his rectum which the nurses are cleaning.  LABORATORY DATA:  That I have and these from the emergency room show a sodium of 134, potassium 4.2, chloride of 98, his total protein is 5.9, and his albumin is 2.7, his SGOT is 116, SGPT is 53, alkaline phosphatase is 83, total bilirubin 2.5. His last hemoglobin is 8.6, hematocrit 24, white blood count of 11,600. His PT was 14.6, PTT of 26.  IMPRESSION: 1. Mallory-Weiss tear with gastrointestinal bleed. Stable at this time. Agree    with current plan for IV hydration, proton pump inhibitors, and blood    transfusion as necessary. If patient breaks loose again, we will consider    repeat endoscopy first and then surgery. Discuss this with Anselmo Rod,    M.D. I think we have got a consistent plan. 2. Alcohol abuse. Apparently, going through withdrawal. 3. His confusion probably secondary to withdrawals. 4. He is malnourished with albumin 2.7. 5. He has mildly elevated  bilirubin with a total bilirubin of 2.5. He has    coagulopathy with a platelet of 93,000, a PT slightly elevated at 14.6. DD:  10/07/00 TD:  10/07/00 Job: [id number]

## 2011-03-21 NOTE — Discharge Summary (Signed)
[place]. [facility]  Patient:    [name], [name]                     MRN: [id number] Adm. Date:  [id number] Disc. Date: 10/12/00 Attending:  Rosanne Sack CC:         Anselmo Rod, M.D., GI  Kristian Covey, M.D., [facility]   Discharge Summary  DATE OF BIRTH:  November 09, 1933.  DISCHARGE DIAGNOSES:  1. Upper gastrointestinal bleed secondary to large Mallory-Weiss tear.  2. Acute on chronic anemia secondary to gastrointestinal bleed.     a. Status post two unit red blood cells transfusion without complications,        discharge hemoglobin 10.2.  3. Alcohol withdrawal syndrome.  4. Dehydration, resolved.  5. Transient hyperglycemia secondary to problem #1 and problem #2.     a. Negative evidence of diabetes.  6. Hypokalemia, resolved.  7. Macrocytic anemia secondary to alcohol abuse.     a. Normal levels of vitamin B12 and rbc folate.  8. Chronic obstructive pulmonary disease/smoking.  9. Transient thrombocytopenia secondary to alcohol abuse, improved. 10. Transient alcoholic hepatitis, improved.  DISCHARGE MEDICATIONS: 1. Protonix 40 mg p.o. b.i.d. x 30 days, then to be determined by    Anselmo Rod, M.D. 2. Multivitamins one tablet p.o. q.d. x 2 weeks then stop.  DISCHARGE FOLLOW-UP AND DISPOSITION:  [name] is being discharged home to the care of his ex-wife.  She will be helping [name] until he medically improves and recovers from this hospital stay.  The patients daughter is currently working on providing a more permanent home.  From the medical standpoint, Kristian Covey, M.D., at [facility], will continue taking care of this patient.  [name] ended in our service after being labeled as an unassigned case.  He remained in our service until discharge after discovering that his primary care Jacqulynn Shappell, Dr. Caryl Never, had been following prior to admission.  For continuity, we  decided to follow this man until discharge from [facility].  Dr. Loreta Ave will see [name] upon discharge in about 10 to 14 days from the GI standpoint.  We recommended following the patients hemoglobin.  The primary care physician will help encouraging being off of alcohol and smoking. Our case managers and Child psychotherapist provided him with information about drug rehab including AA information on [facility].  [name] was recommended strongly to be off of these two substances.  Also we told the patient to avoid NSAIDs.  CONSULTATIONS:  Anselmo Rod, M.D., GI.  PROCEDURES: 1. Upper endoscopy consistent with a large Mallory-Weiss tear without evidence    of active bleeding. 2. Two units rbc transfusion on October 07, 2000, without complications.  DISCHARGE LABORATORY DATA:  Hemoglobin 10.2, MCV 94, wbc 7.5, platelets 117. Sodium 138, potassium 3.4, chloride 106, CO2 30, BUN 6, creatinine 0.7, glucose 98.  SGOT 44, SGPT 27, total bilirubin 0.5, alkaline phosphatase 74, albumin 2.4.  HOSPITAL COURSE:  #1 - UPPER GASTROINTESTINAL BLEEDING SECONDARY TO MALLORY-WEISS TEAR:  [name] is a very pleasant 77 year old male admitted to [facility] on October 07, 2000, with clear signs of upper GI bleed in the setting of alcohol abuse, recurrent vomiting and NSAID use.  The patients hemoglobin dropped from 11.5 to 8.6 in about four hours associated with three melanotic stools in the emergency department.  The patient was resuscitated with IV fluids and  two units rbc transfusion.  The post transfusion hemoglobin was 10.1.  A GI consult was obtained for upper endoscopy.  Dr. Loreta Ave found a large Mallory-Weiss tear that did not have signs of active bleeding.  The patient was placed in an ICU bed for close cardiac and hemoglobin monitoring. The day #2 hemoglobin was 9.5.  Hemoglobin on day #3 and day #4 remained stable at 10.2.  No further signs of active  bleeding were noticed.  The patient remained hemodynamically stable.  The intravenous Protonix was switched to capsules without complications.  Dr. Loreta Ave recommended proton pump inhibitors twice a day for a month.  By the time of discharge, the patient was hemodynamically stable and afebrile.  His hemoglobin had been stable for about four days.  No evidence of active GI bleeding was noticed.  #2 - ALCOHOL WITHDRAWAL SYNDROME:  [name] has a chronic alcohol abuse and dependency history.  In the emergency department, he had clear signs of early withdrawal syndrome.  Intravenous Ativan was given with good results.  Once [name] was able to swallow, Librium was given and then tapered off.  At the time of discharge, no evidence of alcohol withdrawal was noticed.  AA information as well as [facility] was provided by the case manager. The patients family seemed quite involved.  The patient understood that his bleeding problems were related to his alcoholism.  Also transient hepatitis, thrombocytopenia, hypokalemia, malnutrition, and macrocytic anemia were noticed all related to his alcoholism.  The patients LFTs improved with supportive care. The platelet count bottomed out to 64,000.  By the time of discharge it was 117,000.  The patients vitamin B12 level and rbc folate were within normal limits.  It was thought that the macrocytic degree of anemia was related to his alcoholism.  Multivitamins, thiamine were given to the patient. Thiamine was given for five days.  Multivitamins will be continued upon discharge.  #3 - HYPOKALEMIA:  No cardiac complications were noticed in association with his electrolyte abnormality.  Potassium was supplemented by intravenous and p.o. route.  The serum potassium level was within normal limits at the time of discharge.  The magnesium level was within normal limits.  #4 - DEHYDRATION:  Intravenous hydration therapy was used for about three days.  By the time of  discharge, no signs of dehydration were noticed.  Mr.  Clayton was able to have an adequate p.o. intake.  MEDICAL CONDITION ON DISCHARGE:  Improved. DD:  10/12/00 TD:  10/12/00 Job: [id number]

## 2011-03-21 NOTE — Op Note (Signed)
[facility]  Patient:    [name]                     MRN: [id number] Proc. Date: 10/07/00 Adm. Date:  [id number] Attending:  Rosanne Sack CC:         Rosanne Sack, M.D.  Sandria Bales. Ezzard Standing, M.D.   Operative Report  DATE OF BIRTH:  1934/04/02  PROCEDURE PERFORMED:  Esophagogastroduodenoscopy.  ENDOSCOPIST:  Anselmo Rod, M.D.  INSTRUMENT USED:  Olympus video panendoscope.  INDICATION FOR PROCEDURE:  Hematemesis with anemia in a 77 year old white male with a history of alcohol abuse.  Rule out esophageal varices, Mallory-Weiss tear, peptic ulcer disease, alcoholic gastropathy, etc.  PREPROCEDURE PREPARATION:  Informed consent was procured from the patients family (daughter, [name]) and from [employer], M.D. because of the urgency of the situation.  The patient was given 2 units of packed red blood cells.  Basic labs were checked, and endoscopy was done to further evaluate the hematemesis.  Repeat CBC was down from 10 to 8 g/dl.  PREPROCEDURE PHYSICAL EXAMINATION:  VITAL SIGNS:  The patient had marginal blood pressure of 80/40.  Respiratory rate 26 per minute, pulse 110 per minute.  He was afebrile.  HEENT:  Atraumatic, normocephalic.  EOMI.  Facial symmetry preserved.  NECK:  Supple.  CHEST:  Clear to auscultation.  S1, S2 regular.  ABDOMEN:  Soft with normal abdominal bowel sounds.  DESCRIPTION OF THE PROCEDURE:  The patient was placed in left lateral decubitus position and sedated with 2.5 mg of Versed intravenously.  Once the patient was adequately sedated and maintained on low-flow oxygen and continuous cardiac monitoring, the Olympus panendoscope was advanced through the mouthpiece, over the tongue, into the esophagus under direct vision.  No esophageal varices, stricture, Barretts, or esophagitis was seen.  There was a large Mallory-Weiss tear at the GE junction with almost a  crater-like lesion and a clot overhanging this lesion.  No injections were done because of the patients marginal pressure and the fact that there was no active bleeding at this time.  There were two smaller Mallory-Weiss lesions seen across the large tear that did not have any evidence of overlying clots or visible vessels. The rest of the gastric mucosa appeared normal, and so did the proximal small bowel.  Retroflexion was not done because of the large Mallory-Weiss lesion at the GE junction.  IMPRESSION: 1. Three Mallory-Weiss tears, one large crater-like lesion with overhanging    clot. 2. Normal-appearing esophagus, gastric mucosa, and proximal small bowel except    for the lesions mentioned above.  RECOMMENDATIONS: 1. ICU observation.  Surgical observation has been discussed with Dr. Ovidio Kin. 2. Transfuse 2 units now. 3. Serial CBCs. 4. Four-point soft restraints as the patient is in DTs. 5. Ativan protocol for DTs. DD:  10/07/00 TD:  10/08/00 Job: [id number]

## 2011-03-21 NOTE — Discharge Summary (Signed)
[facility]  Patient:    [name] Visit Number: [id number] MRN: [id number]          Service Type: PSY Location: 500 [id number] Attending Physician:  Rachael Fee Dictated by:   Reymundo Poll Dub Mikes, M.D. Admit Date:  03/28/2002 Discharge Date: 04/08/2002                             Discharge Summary  CHIEF COMPLAINT AND PRESENTING ILLNESS:  This was the [name] for this 77 year old male, involuntarily committed.  He was readmitted for alcohol detox.  Remained sober for 1 week after last detox, started drinking again, claimed "Im lonely and need to be around people."  Had thoughts of hanging himself.  Petitioned by the family. Upon admission denied any suicidal or homicidal ideation.  PAST PSYCHIATRIC HISTORY:  Second time admitted.  First time April 15-21. Seventeen years sober prior to that.  SUBSTANCE ABUSE HISTORY:  Sober for 1 week, then relapsed.  Has been on and off alcohol since 1990.  MEDICAL HISTORY:  Heartburn.  Denies any other medical problems.  MEDICATIONS:  Was not taking any medications.  PHYSICAL EXAMINATION:  Performed, failed to show any acute findings.  MENTAL STATUS EXAMINATION:  Reveals a fully alert, older male, cooperative. Mood depressed, affect depressed and polite.  Speech normal and spontaneous. Thought process logical and coherent.  No suicidal ideas, no homicidal ideas, no psychosis.  Cognition well preserved.  ADMITTING  DIAGNOSES: Axis I:    1. Alcohol dependence.            2. Rule out depressive disorder not otherwise specified. Axis II:   No diagnosis. Axis III:  Heartburn. Axis IV:   Moderate. Axis V:    Global assessment of function upon admission 30, highest            global assessment of function in past year 60.  COURSE IN THE HOSPITAL:  He was admitted and started on intensive individual and group psychotherapy.  He was detoxified using phenobarbital.  Due  to the ongoing depressive process, he was started on Zoloft that he tolerated well. He was detoxified uneventfully.  He was discharged on May 14.  He was in full contact with reality.  Mood improved, affect brighter.  Was accepted into halfway house.  He was going to halfway house and work on a long time sobriety.  DISCHARGE  DIAGNOSES: Axis I:    1. Alcohol dependence.            2. Depressive disorder not otherwise specified. Axis II:   No diagnosis. Axis III:  Heartburn. Axis IV:   Moderate. Axis V:    Global assessment of function upon discharge 55-60.  DISCHARGE MEDICATIONS: 1. Protonix 40 mg daily. 2. Zoloft 50 mg daily. 3. Trazodone 150 at bedtime as needed for sleep.  DISPOSITION:  Follow up at [facility]. Dictated by:   Reymundo Poll Dub Mikes, M.D. Attending Physician:  Rachael Fee DD:  04/27/02 TD:  04/28/02 Job: [id number]

## 2011-03-21 NOTE — Assessment & Plan Note (Signed)
[name]                         GASTROENTEROLOGY OFFICE NOTE   [name], [name]                     MRN:          [id number]  DATE:03/05/2007                            DOB:          May 15, 1934    REFERRING PHYSICIAN:  Kari Clayton, M.D.   PROBLEM:  1. Indigestion.  2. Colorectal neoplasia screening.   HISTORY:  [name] is a 77 year old white male referred by Dr. Clelia Clayton for  screening colonoscopy.  He has not had any prior colon evaluation.  He  does have family history of colon cancer in his mother, diagnosed in her  53s, and has a sister with colon polyps.  The patient denies any lower  GI symptoms, specifically no abdominal pain, cramping, changes in his  bowel habits, melena, or hematochezia.  He does relate a remote history  of ulcers.  He does take Advil on a fairly regular basis usually four  per day for neuropathy symptoms in his feet.  He says he does have  chronic problems with heartburn and indigestion over the past year or so  and has been eating Rolaids on a daily basis which are helpful.  He  denies any dysphagia or odynophagia.  He generally does not have any  nocturnal symptoms and says generally he is bothered post prandially.  He also complains of a lot of gas.  The patient has had prior EGD done  in 2001, at which time he bled from Mallory-Weiss tears.  This was in  association with alcohol abuse.   CURRENT MEDICATIONS:  1. Baby aspirin 81 mg daily.  2. TriCor 150 daily.  3. Lyrica 100 mg t.i.d.  4. B-1 daily.  5. Multivitamin daily.  6. ProAir daily.  7. Spiriva daily.   ALLERGIES:  NO KNOWN DRUG ALLERGIES.   PAST HISTORY:  Pertinent for:  1. Hyperlipidemia.  2. Remote appendectomy.  3. COPD.  4. Previous history of ETOH abuse, now abstinent with a history of      Mallory-Weiss tear in 2001.   SOCIAL HISTORY:  The patient is divorced, lives alone.  He is retired.  He is a smoker, one and a half packs per  day.  No current ETOH, quit in  2001.   FAMILY HISTORY:  Pertinent for diabetes in his mother and colon cancer  in his mother diagnosed in her 20s.  Again, one sister with colon  polyps.   REVIEW OF SYSTEMS:  Pertinent only for eyeglasses, decreased auditory  acuity.  CARDIOVASCULAR:  Negative for chest pain or anginal symptoms.  PULMONARY:  Pertinent for shortness of breath with exertion related to  his COPD.  GENITOURINARY:  Negative.  MUSCULOSKELETAL:  Negative.  GI:  As outlined above.  NEUROLOGIC:  Negative.  All other review of systems  are completely negative.   PHYSICAL EXAMINATION:  GENERAL:  A well developed white male in no acute  distress.  The patient is alert and oriented  x3.  VITAL SIGNS:  Not available at the time of this dictation.  CARDIOVASCULAR:  Regular rate and rhythm with S1 and S2.  No murmur,  rub, or  gallop.  PULMONARY:  Decreased breath sounds in the bases.  ABDOMEN:  Soft and nontender.  There is no palpable mass or  hepatosplenomegaly.  No guarding or rebound.  Bowel sounds are active.  RECTAL:  Not done today.  EXTREMITIES:  Without clubbing, cyanosis, or edema.  There is no  stigmata of chronic liver disease.   IMPRESSION:  86. A 77 year old white male with a family history of colon cancer who      presents for colon neoplasia screening, currently asymptomatic.  2. Chronic heartburn consistent with gastroesophageal reflux disease,      rule out Barrett's.  3. History of Mallory-Weiss tear, 2001, related to ETOH use.  4. Chronic obstructive pulmonary disease.   PLAN:  1. Schedule colonoscopy at his convenience.  2. Schedule simultaneous EGD.  The procedures were discussed in detail      with the patient and his daughter including risks, benefits, and      alternatives. They understood.  3. Trial of Nexium at 40 mg p.o. q.a.m.      Joseph Gip, PA-C  Electronically Signed      [name]. Joseph Goodell, MD  Electronically Signed   AE/MedQ  DD:  03/08/2007  DT: 03/08/2007  Job #: [id number]   cc:   [name], M.D.

## 2011-03-21 NOTE — H&P (Signed)
[facility]  Patient:    [name], [name] Visit Number: [id number] MRN: [id number]          Service Type: PSY Location: 500 [id number] Attending Physician:  Rachael Fee Dictated by:   Young Berry Scott, N.P. Admit Date:  03/28/2002 Discharge Date: 04/08/2002                     Psychiatric Admission Assessment  DATE OF ADMISSION:  May 30, 2002.  IDENTIFYING INFORMATION:  This is a 77 year old Caucasian male who is divorced, voluntary admission.  HISTORY OF THE PRESENT ILLNESS:  This patient presented in the emergency room requesting detox from alcohol, having relapsed on alcohol approximately 2 weeks ago, now drinking approximately 1 case of beer per day.  He had been sober approximately 4 weeks following his last discharge in early June of 2003.  He reports he was at a halfway house in [place] for 4 weeks during his period of sobriety, but he disliked the way he was fed and the rules at the halfway house, so he caught a cab back to [place].  [place] in [place], and he started drinking again and has now been drinking approximately 2 weeks.  He denies any homicidal ideation or suicidal ideation or auditory or visual hallucinations.  He has not been in touch with is family because he states that they will no longer associate with him because of his drinking.  He denies any other substance abuse.  PAST PSYCHIATRIC HISTORY:  The patient has no current outpatient psychiatric [facility].  This is his 3rd or 4th detox admission in the past 6 months here at [facility].  He has also been seen at [facility] in the past.  He has no history of suicide attempts or specific treatment for depression.  He does have a long history of alcohol abuse and states his longest period sober is 30+ years in the distant past.  Most recently, he relapsed on alcohol approximately a year to 2 years ago after a 17 year period of  sobriety.  SOCIAL HISTORY:  The patient has a basic education.  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  He was married for several years and has 2 adult children.  His wife divorced him approximately 9 years ago because of his alcohol use.  He reports that he has been alienated from his daughter and his other child because of his use of alcohol.  He has previously stayed with his sister who also lives in [address], but he does not like it there because his sister argues with her husband and this gets on his nerves.  The patient has been living in a [place] in [place], [place], and he states there is no electricity or water to it, and has been working with his attorney to sell some of his property in order to have some cash.  The patient denies any legal charges or legal problems; however he does not have a drivers license has been revoked and the reason is unclear.  FAMILY HISTORY:  Remarkable for a father with a history of alcohol abuse.  ALCOHOL AND DRUG HISTORY:  Alcohol abuse is noted above.  The patient denies any other substance abuse.  He has no history of other substance abuse.  He smokes approximately 1-2 packs of cigarettes daily.  PAST MEDICAL HISTORY:  The patient has been followed in the past by the  [facility] and at other times by [facility].  He is not up to date with any current [facility] at this time.  Medical problems:  The patient denies any current complaint.  His past history is remarkable for at least 1 inpatient admission in 2001 for GI bleed secondary to a Mallory Weiss tear, and for alcohol intoxication, this accompanied by macrocytic anemia and transient alcoholic hepatitis, chronic obstructive pulmonary disease, and hypokalemia.  MEDICATIONS:  None.  DRUG ALLERGIES:  None.  POSITIVE PHYSICAL FINDINGS:  The patients physical examination was done in the emergency room by Dr. Rennis Chris.  At that time his alcohol  level was less than 5.  His urine drug screen was negative.  His metabolic panel is essentially normal except for a mildly elevated glucose at 135.  His electrolytes were within normal limits at that time.  His total bilirubin was 1.0, his SGOT 30, his SGPT 13.  His routine hematology was also within normal limits, with hemoglobin 15.9, hematocrit 45.9, and his MCV at 94.2, platelets at 230,000.  Vital signs on admission to the unit:  Temp 98.4, pulse 64, respirations 20, blood pressure 140/76.  He is approximately 5 feet 11 inches tall and weighs 136 pounds.  MENTAL STATUS EXAMINATION:  This is a slim, muscular built, Caucasian male, who appears to be approximately 10 years older than his stated age of 77.  He is dirty, with considerable body odor.  He is fully alert.  He is in no distress, good eye contact.  He is calm and cooperative.  Affect is appropriate.  Normal motor.  Speech is normal and relevant.  Mood is euthymic. Thought process is logical, without any deficits.  No evidence of suicidal ideation, homicidal ideation, no psychosis.  The patients thought content is dominated by his concerns about the quality of his lifestyle.  He is lonely. He has no companionship up in [place].  He is mostly concerned with his previous detox followup placements, about the quality of the meals and the companionship in the various places where he has been.  He has been mostly unhappy with the fed him or the kind of food he was given.  Cognitively, he is intact and oriented x3.  Insight is poor, judgment not impaired, impulse control within normal limits, intelligence average to above average.  The patient does not seem to have given much thought as to how to stay sober and basically blames his unhappiness on the fact that he likes to be around people and is not able to find a place where he feels really happy.  ADMISSION DIAGNOSES: Axis I:    1. Ethyl alcohol abuse and dependence.             2. Rule out mood disorder. Axis II:   Deferred. Axis III:  None at this time. Axis IV:   Moderate conflict with his family and other support systems in             relation to his alcohol use. Axis V:    Current 40, past year 91.  INITIAL PLAN OF CARE:  Voluntarily admit the patient to detox him from alcohol with a goal being a safe detox in 5 days.  Q. 15 minute checks are in place. We have elected to start him on a phenobarbital protocol and give him a 1/2 loading dose and he is tolerating this well.  Meanwhile we will simply observe him.  He is not suicidal at this time, so  we will not be starting him on antidepressants and will see how his mood responds to detox, and we will work with him on some type of aftercare program, hopefully to prevent relapse and strengthen his commitment.  ESTIMATED LENGTH OF STAY:  5 days. Dictated by:   Young Berry Scott, N.P. Attending Physician:  Rachael Fee DD:  05/31/02 TD:  05/31/02 Job: [id number]

## 2011-03-21 NOTE — H&P (Signed)
[place]. [facility]  Patient:    NUR, KRASINSKI                     MRN: [id number] Adm. Date:  [id number] Attending:  Cathren Laine CC:         Anselmo Rod, M.D.   History and Physical  DATE OF BIRTH:  [birth year]  PROBLEMS: 1. Upper gastrointestinal bleed, secondary to alcohol abuse and    nonsteroidal anti-inflammatory drug use:    a. Rule out esophagitis, gastritis, duodenitis, peptic ulcer       disease, and esophageal varices. 2. Early ETOH withdrawal syndrome. 3. Hyperglycemia, rule out diabetes mellitus. 4. Dehydration. 5. Tobacco abuse/chronic obstructive pulmonary disease. 6. Chronic alcohol abuse:    a. Alcoholic hepatitis.    b. Macrocytic anemia, hemoglobin 11.5, MCV 103.    c. Thrombocytopenia:       1. Hypoalbuminemia/malnutrition.  CHIEF COMPLAINT:  Hematemesis.  HISTORY OF PRESENT ILLNESS:  Mr. Halsted is a very pleasant 77 year old male, who presents with complaints of hematemesis since lunch time on October 06, 2000.  The patient describes several episodes of hematemesis associated with dyspepsia and nausea.  He also describes intermittent episodes of coffee-ground emesis between hematemesis since yesterday.  He denies chest pain, shortness of breath, orthopnea, fever, chills, diarrhea, or constipation.  He describes since this morning, melanotic stools.  He denies any previous history of similar symptoms.  He drinks about six beers a day for at least 11 years.  He denies syncope, focal weakness, swallowing problems, amaurosis fugax, skin rash, synovitis, lower extremity swelling.  No rhinorrhea, no abdominal symptoms, no urinary symptoms, no weight loss, no night sweats, no headaches, no blurred vision.  PAST MEDICAL HISTORY:  As in the problem list.  ALLERGIES:  No known drug allergies.  CURRENT MEDICATIONS:  None except for Goodys Powders, two q. week.  SOCIAL HISTORY:  The patient is divorced and has two  grandchildren.  He works as a Psychologist, prison and probation services.  He smokes about two packs per day of cigarettes for about 52 years.  He drinks as described in the problem list and HPI sections.  FAMILY HISTORY:  No diabetes mellitus in the family.  No early coronary artery disease.  No hypertension or strokes.  His brother and mother died of cancer, though he does not know the type.  His father died after a lower extremity amputation at age 77.  REVIEW OF SYSTEMS:  As in the HPI.  PHYSICAL EXAMINATION:  VITAL SIGNS:  Temperature 97.9 degrees, blood pressure 115/74, heart rate 117, respirations 24, oxygen saturation 97% on room air.  HEENT:  Normocephalic, atraumatic.  Slight icteric sclerae.  Conjunctivae within normal limits.  PERRLA.  EOMI.  Funduscopic examination negative for papilledema or hemorrhages.  Slightly dry mucous membranes.  Oropharynx clear. The patient uses dentures.  NECK:  Supple, no jugular venous distention, no bruits.  No adenopathy.  No thyromegaly.  LUNGS:  Clear to auscultation bilaterally, without crackles or wheezes.  Air movement bilaterally.  CARDIAC:  Slightly tachycardic.  No murmurs, rubs, or gallops.  Normal S1, S2.  ABDOMEN:  Soft, nontender, nondistended.  Bowel sounds were present.  No hepatosplenomegaly.  No rebound, no guarding, no masses, no bruits.  RECTAL:  Black stool, guaiac positive.  Prostatic size 2+, nontender, without nodules.  GENITOURINARY:  Within normal limits.  EXTREMITIES:  No cyanosis, clubbing, or edema.  Pulses 1+ bilaterally.  NEUROLOGIC:  Alert and oriented  x 3.  Strength 5/5 in all extremities.  Deep tendon reflexes within normal limits.  Cranial nerves II-XII intact.  Sensory intact.  Plantar reflexes downgoing bilaterally.  LABORATORY DATA:  Chest x-ray:  Chronic obstructive pulmonary disease, no active disease.  Hemoglobin 11.5, MCV 103, WBC 16.0, absolute neutrophil count 14.2, platelets 117.  Sodium 134,  potassium 4.2, chloride 98, CO2 of 24, BUN 36, creatinine 1.2, glucose 189.  Albumin 2.7, total protein 5.9, SGOT 116, SGPT 53, alkaline phosphatase 83, total bilirubin 2.5.  PT 14.6, PTT 26.  Alcohol level pending. Amylase and lipase pending.  Urine drug screen pending.  ASSESSMENT/PLAN: 1. Upper gastrointestinal bleed. - The patient describes clear signs of    upper gastrointestinal bleed since lunch time of October 06, 2000.    The patient describes a previous history of similar symptoms, though    he has been a heavy drinker for the last at least 11 years.  The    abdominal examination remains benign at this point, and there is no    evidence of portal hypertension at this point.  Currently Mr. Christopher    is hemodynamically stable.  The hemoglobin seems to be within an    acceptable range.  I suspect an acute on chronic anemia.  The differential    diagnoses include esophagitis, gastritis, and/or duodenitis, due to    alcohol abuse and nonsteroidal anti-inflammatory drug use.  Other    etiologies like esophageal viruses or peptic ulcer disease are possible.  PLAN:  We will go ahead and admit the patient to a telemetry bed.  Intravenous Protonix q.12h. has been started.  The CBC will be monitored closely.  Type and screen has already been obtained.  Will prove with supportive care and IV fluids.  The patient will remain n.p.o. until a GI consultation is obtained for an upper endoscopy.  The stools will be guaiaced.  2. Early alcohol withdrawal syndrome.  As described in the HPI, Mr. Franze    drinks at least six beers a day.  There are already signs of early    tremor, consistent with withdrawal syndrome.  The patient denies a previous    history of seizure activity.  PLAN:  Will start seizure precautions.  Thiamine, multivitamins, Librium, and Ativan also will be used to control the patients withdrawal syndrome.  Upon discharge, outpatient rehabilitation information will be  provided.  3. Hyperglycemia. - The patient denies any previous history of diabetes,     though he has had no real medical followup prior to this admission.    Mr. Wojtkiewicz denies classic symptoms of undiagnosed diabetes mellitus.    This hyperglycemia could be related to stress, due to catecholamine    release, associated with the upper gastrointestinal bleed.  PLAN:  Will go ahead and obtain a hemoglobin A1C.  A fasting CBG will be obtained tomorrow.  If we confirm that Mr. Bozman has diabetes mellitus, CBGs will be obtained q.a.c. and q.h.s.  Sliding scale insulin will then be started.  Teaching also will be provided.  4. Dehydration.  - Physical examination is consistent with dehydration.  PLAN:  For now will start IV fluids.  The fluid balance will be monitored and will check orthostatic blood pressures every day.  5. Macrocytic anemia.  -  This problem seems to be related to his alcohol    abuse.  A B12 level and RBC folate will be obtained today, to start a    workup for this problem.  Also a degree of iron deficiency could be    possible due to intermittent gastritis, in light of the patients    alcohol abuse. 6. ETOH hepatitis or alcoholic hepatitis.  - The patient denies any history    of IV drug abuse or blood transfusions.  PLAN:  For now will follow up the LFTs.  Supportive care will be provided. The abdominal examination is benign.  No need for an abdominal ultrasound or a CT scan is indicated at this point.  7. Chronic obstructive pulmonary disease/tobacco abuse.  -  The lung    examination remains stable.  No signs of bronchospasm are noticed.  PLAN:  We spent about 10 minutes talking about the importance of smoking cessation.  The patient declined a nicotine patch at this point.  8. Thrombocytopenia.  - No evidence of petechiae.  The PT and PTT are    within normal limits.  This slight thrombocytopenia is likely to be    associated with alcohol abuse (bone  marrow suppression).  PLAN:  For now we will repeat a platelet count this afternoon and tomorrow morning, and follow clinically. DD:  10/07/00 TD:  10/07/00 Job: [id number]

## 2011-03-21 NOTE — Discharge Summary (Signed)
NAME:  [name], [name]                        ACCOUNT NO.:  [id number]   MEDICAL RECORD NO.:  [id number]                  PATIENT TYPE:  IPS   LOCATION:  0300                                 [facility]ACILITY:  [facility]   PHYSICIAN:  Geoffery Lyons, MD                     DATE OF BIRTH:  1934/10/15   DATE OF ADMISSION:  05/30/2002  DATE OF DISCHARGE:  06/07/2002                                 DISCHARGE SUMMARY   CHIEF COMPLAINT AND PRESENT ILLNESS:  This was one of multiple admissions to  [facility] for this 77 year old white male,  divorced, voluntarily admitted.  He presented to the emergency room  requesting detoxification from alcohol, having relapsed approximately two  weeks prior to this admission, one case of beer per day.  He had been sober  for four weeks following his last discharge in early June 2003.  He was in a  halfway house in [place] for four weeks that he did not like so he left.  He went back [place] the [place].  He started drinking and had been drinking for two weeks.  He had  no homicidal or suicidal ideas.  He had not been in touch with his family.   PAST PSYCHIATRIC HISTORY:  He had multiple attempts to treat, multiple  detoxifications, [facility], [facility], [facility], but not  successful.   SUBSTANCE ABUSE HISTORY:  As already stated, extensive alcohol dependency.   PAST MEDICAL HISTORY:  GI bleed secondary to Mallory-Weiss tear.   MEDICATIONS:  None.   PHYSICAL EXAMINATION:  Physical examination was performed, failed to show  any acute findings.   MENTAL STATUS EXAM:  Mental status exam revealed a slim, muscular built  male, looked 10 years older than his stated age of 53, body odor, alert, no  distress,  good eye contact, calm, cooperative, appropriate affect, normal  motor.  Speech: Normal and relevant.  Mood: Euthymic.  Thought processes:  Logical, dealt with his  wanting to quit.  No suicidal ideas, no homicidal  ideas.  Cognitive: Cognition was well preserved.   ADMISSION DIAGNOSES:   AXIS I:  Alcohol dependence.   AXIS II:  No diagnosis.   AXIS III:  No diagnosis.   AXIS IV:  Moderate.   AXIS V:  Global assessment of functioning upon admission 35, highest global  assessment of functioning in the last year 60.   LABORATORY DATA:  Other laboratory workup: CBC was within normal limits.  Blood chemistries were within normal limits.  Thyroid profile was within  normal limits.   HOSPITAL COURSE:  He was admitted, he was detoxified using phenobarbital.  He basically claimed he did not have a place to go, he did not want to go  back to the same [name] where he was before but  he had sabotaged every single  attempt to get him placed.  He was in full contact with reality.  He was  minimally involved in treatment, no insight, no changes.  He had expressed  multiple times that he was probably going to go back to drinking.  He had  said that openly so it was felt that he was detoxified, he was going to be  safe to be discharged.  He wanted to be in the hospital forever; so he was  discharged to outpatient treatment where he was going to be followed by [facility].   DISCHARGE DIAGNOSES:   AXIS I:  Alcohol dependence.   AXIS II:  Personality disorder, not otherwise specified.   AXIS III:  No diagnosis.   AXIS IV:  Moderate.   AXIS V:  Global assessment of functioning upon discharge 50.   DISCHARGE MEDICATIONS:  He was discharged on no medications.   FOLLOW UP:  He was to follow-up with [facility] walk-in.                                                 Geoffery Lyons, MD    IL/MEDQ  D:  07/13/2002  T:  07/14/2002  Job:  [id number]

## 2011-03-21 NOTE — Discharge Summary (Signed)
NAME:  [name], [name] NO.:  [id number]   MEDICAL RECORD NO.:  [id number]                   PATIENT TYPE:  IPS   LOCATION:  0506                                 FACILITY:  [facility]   PHYSICIAN:  Jeanice Lim, M.D.              DATE OF BIRTH:  07-Mar-1934   DATE OF ADMISSION:  02/13/2003  DATE OF DISCHARGE:  02/19/2003                                 DISCHARGE SUMMARY   IDENTIFYING DATA:  This is a 77 year old divorced Caucasian male voluntarily  admitted with a history of alcohol dependence, drinking the last four to six  weeks all day long, feeling isolated, endorsing loneliness and depression,  angry and irritable.  This was the third or fourth admission for  detoxification.   MEDICATIONS:  The patient had been on a nerve pill of which the patient  does not know the name and has not been compliant with.   DRUG ALLERGIES:  No known drug allergies.   PHYSICAL EXAMINATION:  GENERAL:  Essentially within normal limits.  NEUROLOGIC:  Nonfocal.   LABORATORY DATA:  Routine admission labs: Urine drug screen was positive for  benzodiazepines.   MENTAL STATUS EXAM:  This was a fully alert, dirty, disheveled, cooperative  male with fair eye contact.  Speech: Within normal limits.  Mood: Euthymic,  mildly depressed at times.  Affect: Restricted.  Thought process: Goal  directed.  Thought content: Negative for dangerous ideation or psychotic  symptoms.  Cognitive: Intact.  Judgment and insight: Fair.   ADMISSION DIAGNOSES:   AXIS I:  Alcohol dependence.   AXIS II:  None.   AXIS III:  1. Tobacco abuse.  2. Chronic obstructive pulmonary disease by history.   AXIS IV:  Moderate stress related to isolation and housing problems.   AXIS V:  40/60   HOSPITAL COURSE:  The patient was admitted, ordered routine p.r.n.  medications, underwent further monitoring, and was encouraged to participate  in individual, group, and milieu therapy.  The patient was  placed on a  detoxification protocol for safe withdrawal and seen by internal medicine to  evaluate thrombocytopenia, COPD, and orthostasis.  All internal medicine  recommendations were followed and the patient was agreeable to go to a  residential program.  He was experiencing positive withdrawal symptoms but  able to tolerate detoxification.  The patient required p.r.n.'s for  breakthrough withdrawal symptoms.  He was encouraged to drink more fluids  and we had to treat nausea and vomiting.  The patient was somewhat sedated  and p.r.n. Librium was decreased in frequency.  The patient gradually  improved and reported no longer experiencing withdrawal symptoms.  He was  motivated to follow up at [facility].   CONDITION ON DISCHARGE:  His condition on discharge was significantly  improved.  Mood was more euthymic.  Affect: Brighter.  Thought processes:  Goal directed.  Thought content: Negative for dangerous ideation or  psychotic  symptoms.  The patient reported motivation to be compliant with  the aftercare plan to go to a residential program, was motivated to remain  abstinent.   DISCHARGE MEDICATIONS:  1. Trazodone 100 mg q.h.s.  2. Protonix 40 mg q.h.s.  3. Advair 250/50 mg Diskus inhaler one puff b.i.d.  4. Atrovent inhaler two puffs t.i.d.   FOLLOW UP:  The patient was to avoid alcohol and to follow up with AA after  [facility] residential program.   DISCHARGE DIAGNOSES:   AXIS I:  Alcohol dependence.   AXIS II:  None.   AXIS III:  1. Tobacco abuse.  2. Chronic obstructive pulmonary disease by history.   AXIS IV:  Moderate stress related to isolation and housing problems.   AXIS V:  Global assessment of functioning on discharge was 55.                                               Jeanice Lim, M.D.    JEM/MEDQ  D:  03/07/2003  T:  03/07/2003  Job:  [id number]

## 2011-04-21 ENCOUNTER — Encounter (HOSPITAL_BASED_OUTPATIENT_CLINIC_OR_DEPARTMENT_OTHER): Payer: Medicare Other | Attending: Internal Medicine

## 2011-05-19 ENCOUNTER — Encounter (HOSPITAL_BASED_OUTPATIENT_CLINIC_OR_DEPARTMENT_OTHER): Payer: Medicare Other | Attending: Internal Medicine

## 2011-08-07 LAB — POCT CARDIAC MARKERS
CKMB, poc: 1.8 ng/mL (ref 1.0–8.0)
Myoglobin, poc: 108 ng/mL (ref 12–200)
Myoglobin, poc: 111 ng/mL (ref 12–200)
Myoglobin, poc: 134 ng/mL (ref 12–200)
Troponin i, poc: <0.05 ng/mL (ref 0.00–0.09)
Troponin i, poc: <0.05 ng/mL (ref 0.00–0.09)

## 2011-08-07 LAB — URINALYSIS, ROUTINE W REFLEX MICROSCOPIC
Bilirubin Urine: NEGATIVE
Glucose, UA: NEGATIVE mg/dL
Ketones, ur: NEGATIVE mg/dL
Specific Gravity, Urine: 1.009 (ref 1.005–1.030)
pH: 6.5 (ref 5.0–8.0)

## 2011-08-07 LAB — COMPREHENSIVE METABOLIC PANEL
ALT: 17 U/L (ref 0–53)
AST: 28 U/L (ref 0–37)
Calcium: 9.1 mg/dL (ref 8.4–10.5)
GFR calc Af Amer: >60 mL/min (ref >60)
Sodium: 138 mEq/L (ref 135–145)
Total Protein: 7.1 g/dL (ref 6.0–8.3)

## 2011-08-07 LAB — DIFFERENTIAL
Eosinophils Absolute: 0.1 10*3/uL (ref 0.0–0.7)
Eosinophils Relative: 1 % (ref 0–5)
Lymphs Abs: 2.7 10*3/uL (ref 0.7–4.0)
Monocytes Relative: 8 % (ref 3–12)

## 2011-08-07 LAB — CBC
MCHC: 33.3 g/dL (ref 30.0–36.0)
Platelets: 242 10*3/uL (ref 150–400)
RDW: 13.7 % (ref 11.5–15.5)

## 2011-10-08 IMAGING — CT CT CHEST W/ CM
2 of 5 series · 16 of 46 positions shown, 18 images · IV contrast (agent unspecified)
Comparison: 10/09/2010

CT CHEST

CLINICAL DATA: Metastatic squamous cell carcinoma from larynx to
lung.  Radiation therapy larynx completed in 6818.  Ongoing
chemotherapy.  Cough and congestion.

CT CHEST, ABDOMEN AND PELVIS WITH CONTRAST
TECHNIQUE: Contiguous axial images of the chest abdomen and pelvis
were obtained after IV contrast administration.
Contrast: 100  ml [id number]

[Series 2: cap with st · axial · 0.73mm/px · z∈[-654,-89]mm · 13 of 129 slices shown, 15 images]
[im 8/129  soft-tissue]
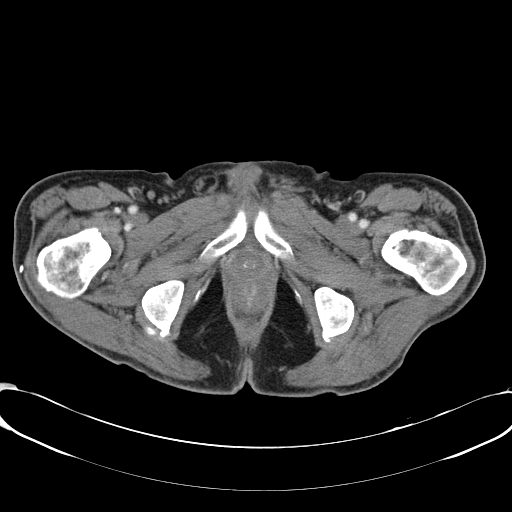
[im 8/129  bone]
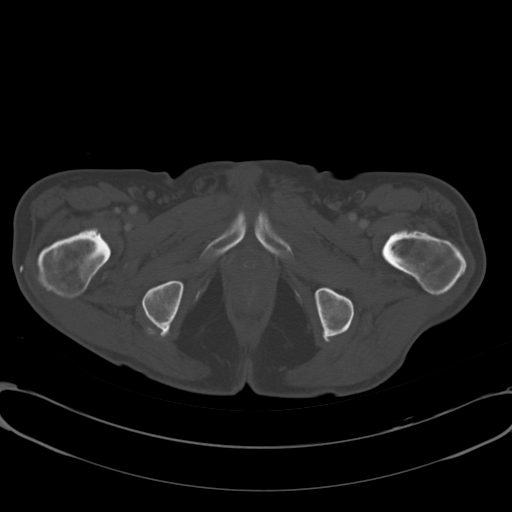
[im 15/129  soft-tissue]
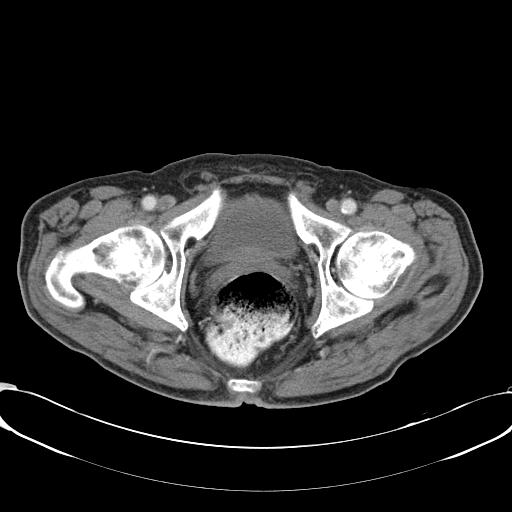
[im 29/129  soft-tissue]
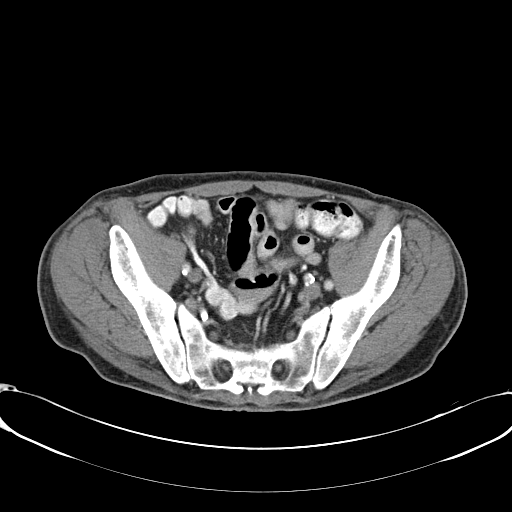
[im 36/129  soft-tissue]
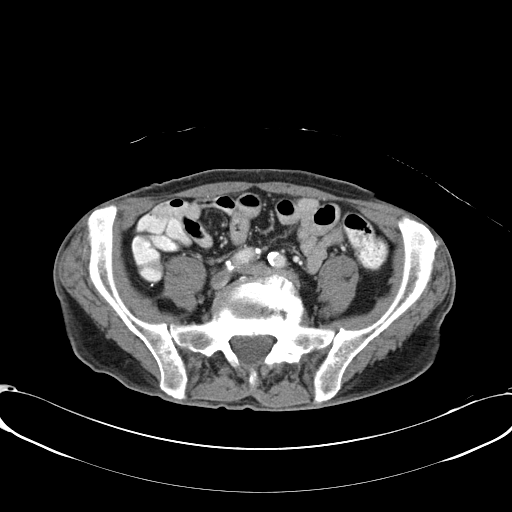
[im 43/129  soft-tissue]
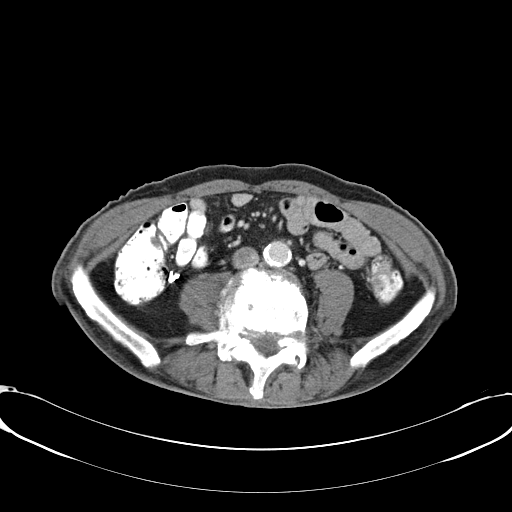
[im 57/129  soft-tissue]
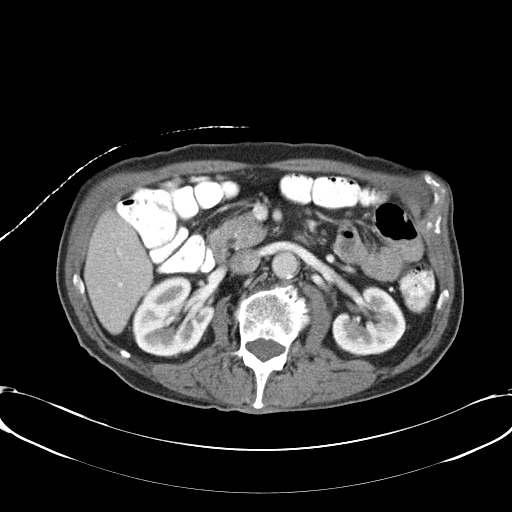
[im 65/129  soft-tissue]
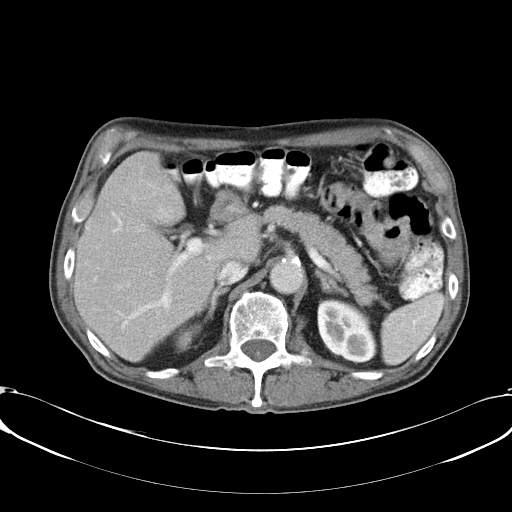
[im 72/129  soft-tissue]
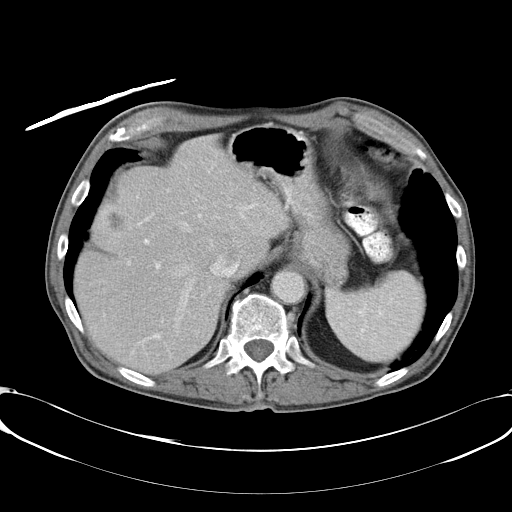
[im 86/129  soft-tissue]
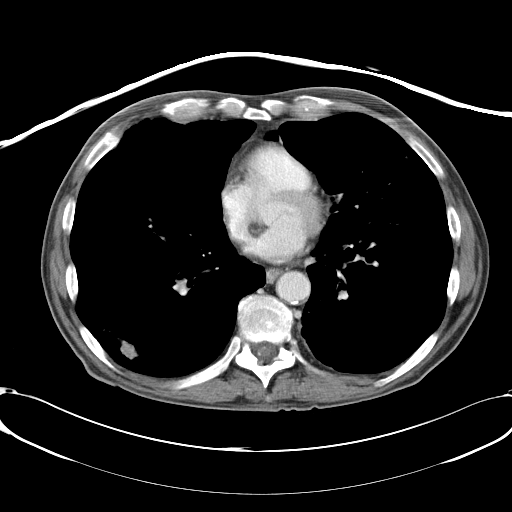
[im 86/129  bone]
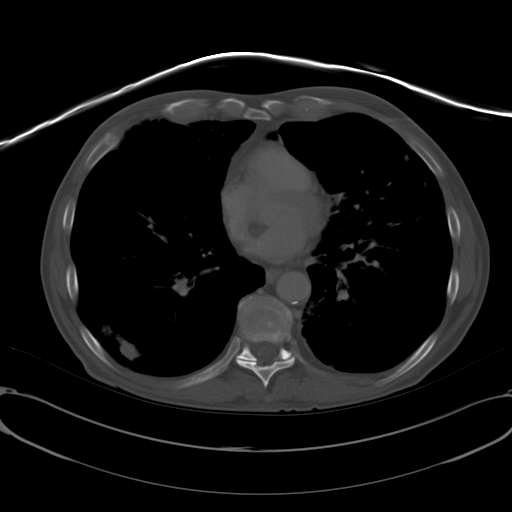
[im 93/129  soft-tissue]
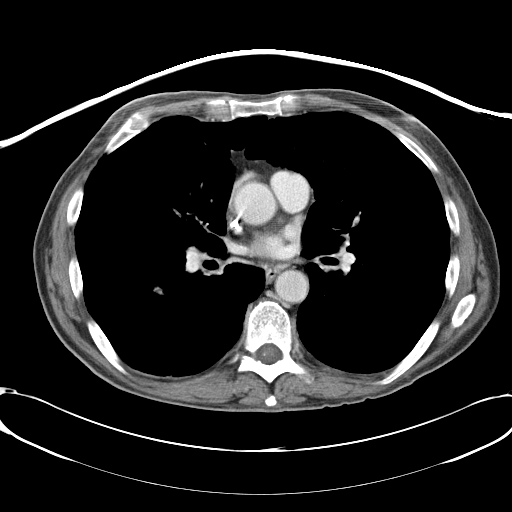
[im 100/129  soft-tissue]
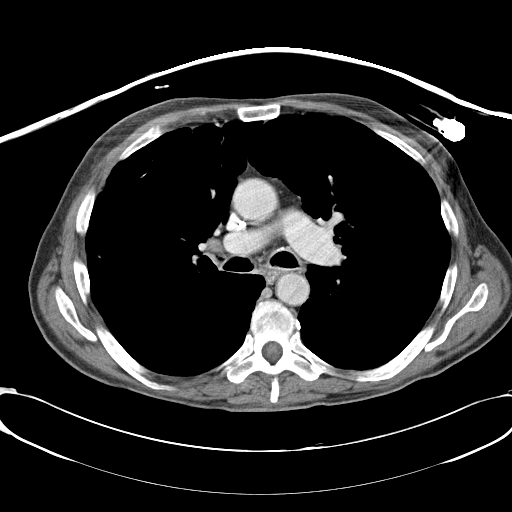
[im 114/129  soft-tissue]
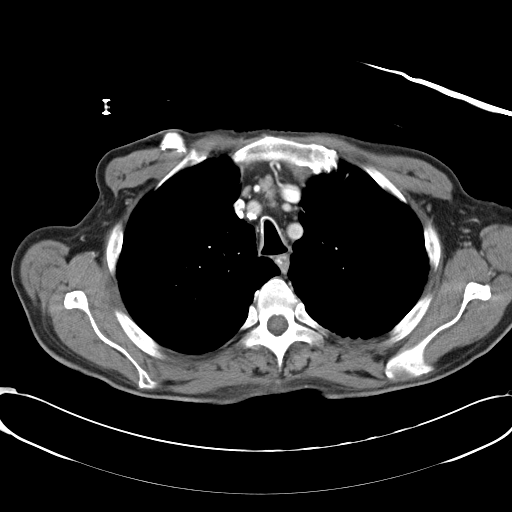
[im 121/129  soft-tissue]
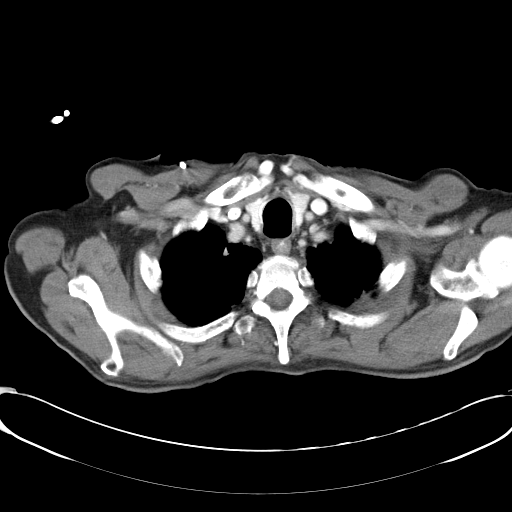

[Series 602: coronal images · coronal · 1.26mm/px · 3 of 78 slices shown]
[im 26/78  soft-tissue]
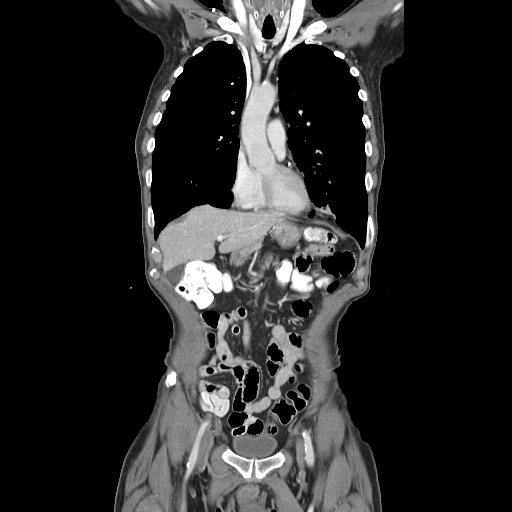
[im 35/78  soft-tissue]
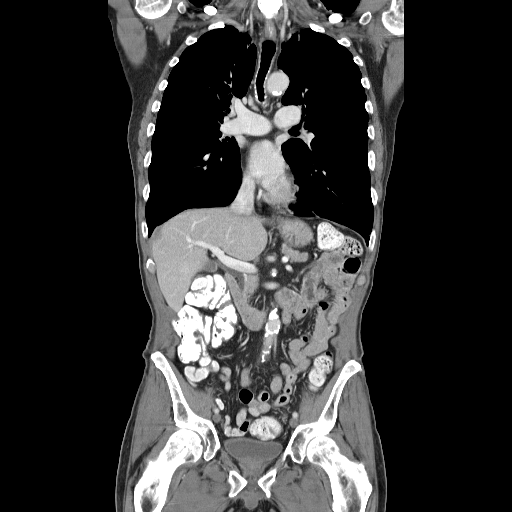
[im 43/78  soft-tissue]
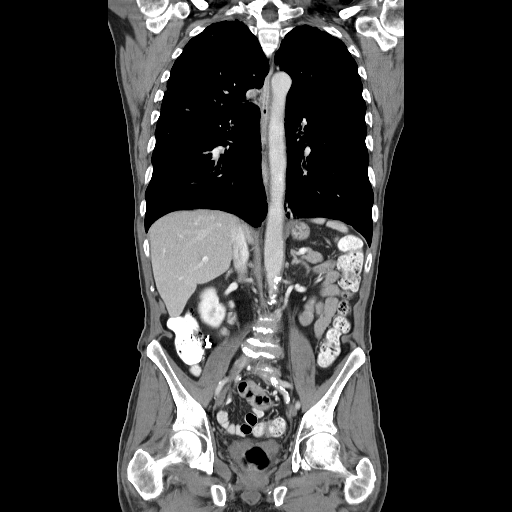

[16 of 46 positions shown; findings below may reference images not displayed]

FINDINGS: Lung windows demonstrate moderate centrilobular
emphysema.
Probable secretions within the dependent trachea at the level of
the carina.  Nonspecific bronchial wall thickening to the bilateral
lower lobes.  Probable secretions within the left lower lobe
segmental bronchi on image 43.

 Right lower lobe lung nodule measures 1.4 x 1.3 cm on image 38
versus 1.2 x 1.0 cm on the prior exam.
Patchy airspace type opacities within the lung bases are similar.
Subpleural left lower lobe lung nodule measures 1.8 x 1.6 cm on
image 46 versus 1.7 x 1.1 cm on the prior exam.
A spiculated left upper lobe nodule measures 1.0 cm on image 40
versus 9 mm on the prior.
Slight enlargement of a right base nodular opacity which measures
1.1 cm on image 44 versus 9 mm on the prior.

Soft tissue windows demonstrate a right-sided Port-A-Cath which
terminates at the high right atrium. Normal heart size without
pericardial or pleural effusion.  Multivessel coronary artery
atherosclerosis.  Small mediastinal lymph nodes which are
unchanged.  No hilar adenopathy.
IMPRESSION: 1.  Slight progression of pulmonary metastasis.
2.  Patchy similar lower lobe predominant airspace disease.
Concurrent fluid within the endobronchial tree.  Given history of
head neck primary, consider exclusion of chronic aspiration.

CT ABDOMEN AND PELVIS
FINDINGS: A subcapsular right hepatic lobe 1.5 cm lesion on image
53 is new, consistent with metastatic disease.  Normal spleen,
stomach, pancreas, gallbladder, biliary tract, adrenal glands,
kidneys.  Circumaortic left renal vein. No retroperitoneal or
retrocrural adenopathy.

Normal caliber of bowel loops.  No ascites.

Tiny fat containing right inguinal hernia. No pelvic adenopathy.
Normal urinary bladder and prostate.  No significant free fluid.
IMPRESSION: 1.  New hepatic metastasis.
2.  No extrahepatic metastatic disease within the abdomen or
pelvis.
